# Patient Record
Sex: Male | Born: 1955 | Race: Black or African American | Hispanic: No | Marital: Single | Smoking: Never smoker
Health system: Southern US, Community
[De-identification: ages and names within clinical notes are randomized; demographics above are authoritative.]

## PROBLEM LIST (undated history)

## (undated) DIAGNOSIS — D649 Anemia, unspecified: Secondary | ICD-10-CM

## (undated) DIAGNOSIS — C801 Malignant (primary) neoplasm, unspecified: Secondary | ICD-10-CM

## (undated) DIAGNOSIS — Z789 Other specified health status: Secondary | ICD-10-CM

## (undated) DIAGNOSIS — N289 Disorder of kidney and ureter, unspecified: Secondary | ICD-10-CM

## (undated) DIAGNOSIS — N429 Disorder of prostate, unspecified: Secondary | ICD-10-CM

## (undated) HISTORY — PX: NO PAST SURGERIES: SHX2092

---

## 2012-06-23 ENCOUNTER — Inpatient Hospital Stay (HOSPITAL_COMMUNITY)
Admission: EM | Admit: 2012-06-23 | Discharge: 2012-07-14 | DRG: 666 | Disposition: A | Discharge status: Skilled Nursing Facility | Payer: MEDICAID | Attending: Internal Medicine | Admitting: Internal Medicine

## 2012-06-23 ENCOUNTER — Encounter (HOSPITAL_COMMUNITY): Payer: Self-pay | Admitting: Nurse Practitioner

## 2012-06-23 ENCOUNTER — Emergency Department (INDEPENDENT_AMBULATORY_CARE_PROVIDER_SITE_OTHER)
Admission: EM | Admit: 2012-06-23 | Discharge: 2012-06-23 | Disposition: A | Discharge status: Nursing Home (Includes ICF) | Payer: Self-pay | Source: Home / Self Care | Attending: Emergency Medicine | Admitting: Emergency Medicine

## 2012-06-23 ENCOUNTER — Emergency Department (HOSPITAL_COMMUNITY): Payer: Self-pay

## 2012-06-23 ENCOUNTER — Encounter (HOSPITAL_COMMUNITY): Payer: Self-pay | Admitting: *Deleted

## 2012-06-23 DIAGNOSIS — R319 Hematuria, unspecified: Secondary | ICD-10-CM | POA: Diagnosis present

## 2012-06-23 DIAGNOSIS — N139 Obstructive and reflux uropathy, unspecified: Principal | ICD-10-CM | POA: Diagnosis present

## 2012-06-23 DIAGNOSIS — C775 Secondary and unspecified malignant neoplasm of intrapelvic lymph nodes: Secondary | ICD-10-CM | POA: Diagnosis present

## 2012-06-23 DIAGNOSIS — D649 Anemia, unspecified: Secondary | ICD-10-CM

## 2012-06-23 DIAGNOSIS — D72829 Elevated white blood cell count, unspecified: Secondary | ICD-10-CM | POA: Diagnosis not present

## 2012-06-23 DIAGNOSIS — R599 Enlarged lymph nodes, unspecified: Secondary | ICD-10-CM | POA: Diagnosis present

## 2012-06-23 DIAGNOSIS — N35919 Unspecified urethral stricture, male, unspecified site: Secondary | ICD-10-CM | POA: Diagnosis present

## 2012-06-23 DIAGNOSIS — Z5900 Homelessness unspecified: Secondary | ICD-10-CM

## 2012-06-23 DIAGNOSIS — R030 Elevated blood-pressure reading, without diagnosis of hypertension: Secondary | ICD-10-CM

## 2012-06-23 DIAGNOSIS — I10 Essential (primary) hypertension: Secondary | ICD-10-CM | POA: Insufficient documentation

## 2012-06-23 DIAGNOSIS — N19 Unspecified kidney failure: Secondary | ICD-10-CM

## 2012-06-23 DIAGNOSIS — N1330 Unspecified hydronephrosis: Secondary | ICD-10-CM | POA: Diagnosis present

## 2012-06-23 DIAGNOSIS — R590 Localized enlarged lymph nodes: Secondary | ICD-10-CM

## 2012-06-23 DIAGNOSIS — N320 Bladder-neck obstruction: Secondary | ICD-10-CM | POA: Diagnosis present

## 2012-06-23 DIAGNOSIS — N2581 Secondary hyperparathyroidism of renal origin: Secondary | ICD-10-CM | POA: Diagnosis present

## 2012-06-23 DIAGNOSIS — C61 Malignant neoplasm of prostate: Secondary | ICD-10-CM | POA: Diagnosis present

## 2012-06-23 DIAGNOSIS — N179 Acute kidney failure, unspecified: Secondary | ICD-10-CM

## 2012-06-23 DIAGNOSIS — IMO0001 Reserved for inherently not codable concepts without codable children: Secondary | ICD-10-CM | POA: Diagnosis present

## 2012-06-23 DIAGNOSIS — R609 Edema, unspecified: Secondary | ICD-10-CM | POA: Diagnosis present

## 2012-06-23 DIAGNOSIS — R339 Retention of urine, unspecified: Secondary | ICD-10-CM | POA: Diagnosis present

## 2012-06-23 HISTORY — DX: Other specified health status: Z78.9

## 2012-06-23 LAB — CBC
HCT: 23 % — ABNORMAL LOW (ref 39.0–52.0)
Hemoglobin: 8 g/dL — ABNORMAL LOW (ref 13.0–17.0)
Hemoglobin: 7.3 g/dL — ABNORMAL LOW (ref 13.0–17.0)
MCH: 26.9 pg (ref 26.0–34.0)
MCHC: 34.4 g/dL (ref 30.0–36.0)
MCV: 78.5 fL (ref 78.0–100.0)
Platelets: 156 10*3/uL (ref 150–400)
RBC: 2.71 MIL/uL — ABNORMAL LOW (ref 4.22–5.81)
RDW: 14.7 % (ref 11.5–15.5)
WBC: 8.9 10*3/uL (ref 4.0–10.5)

## 2012-06-23 LAB — TYPE AND SCREEN: Antibody Screen: NEGATIVE

## 2012-06-23 LAB — POCT I-STAT, CHEM 8
BUN: 58 mg/dL — ABNORMAL HIGH (ref 6–23)
Calcium, Ion: 1.24 mmol/L — ABNORMAL HIGH (ref 1.12–1.23)
Chloride: 105 mEq/L (ref 96–112)
Potassium: 5.3 mEq/L — ABNORMAL HIGH (ref 3.5–5.1)

## 2012-06-23 LAB — BASIC METABOLIC PANEL
BUN: 57 mg/dL — ABNORMAL HIGH (ref 6–23)
CO2: 24 mEq/L (ref 19–32)
Chloride: 101 mEq/L (ref 96–112)
Creatinine, Ser: 5.88 mg/dL — ABNORMAL HIGH (ref 0.50–1.35)
GFR calc Af Amer: 11 mL/min — ABNORMAL LOW (ref >90)
Glucose, Bld: 130 mg/dL — ABNORMAL HIGH (ref 70–99)
Potassium: 4.9 mEq/L (ref 3.5–5.1)

## 2012-06-23 LAB — URINALYSIS, ROUTINE W REFLEX MICROSCOPIC
Bilirubin Urine: NEGATIVE
Glucose, UA: NEGATIVE mg/dL
Hgb urine dipstick: NEGATIVE
Ketones, ur: NEGATIVE mg/dL
Specific Gravity, Urine: 1.006 (ref 1.005–1.030)
pH: 6 (ref 5.0–8.0)

## 2012-06-23 LAB — CK: Total CK: 246 U/L — ABNORMAL HIGH (ref 7–232)

## 2012-06-23 LAB — OCCULT BLOOD, POC DEVICE: Fecal Occult Bld: POSITIVE

## 2012-06-23 LAB — HEPATIC FUNCTION PANEL
AST: 16 U/L (ref 0–37)
Bilirubin, Direct: <0.1 mg/dL (ref 0.0–0.3)
Total Bilirubin: 0.3 mg/dL (ref 0.3–1.2)

## 2012-06-23 LAB — ABO/RH: ABO/RH(D): B POS

## 2012-06-23 LAB — LACTIC ACID, PLASMA: Lactic Acid, Venous: 1 mmol/L (ref 0.5–2.2)

## 2012-06-23 MED ORDER — SODIUM CHLORIDE 0.9 % IV BOLUS (SEPSIS)
500.0000 mL | Freq: Once | INTRAVENOUS | Status: AC
  Administered 2012-06-23: 21:00:00 via INTRAVENOUS

## 2012-06-23 MED ORDER — SODIUM CHLORIDE 0.9 % IV SOLN
INTRAVENOUS | Status: DC
  Administered 2012-06-23 – 2012-06-24 (×2): via INTRAVENOUS

## 2012-06-23 MED ORDER — SODIUM CHLORIDE 0.9 % IV SOLN: INTRAVENOUS | Status: DC

## 2012-06-23 MED ORDER — ONDANSETRON HCL 4 MG/2ML IJ SOLN: 4.0000 mg | Freq: Three times a day (TID) | INTRAMUSCULAR | Status: DC | PRN

## 2012-06-23 NOTE — ED Notes (Signed)
Plan of care is updated with verbal understanding, will continue to monitor pt. Pt denies any pain or complaints at this time.

## 2012-06-23 NOTE — ED Provider Notes (Signed)
History     CSN: [id number]  Arrival date & time 06/23/12  1424   First MD Initiated Contact with Patient 06/23/12 1844      Chief Complaint  Patient presents with  . Acute Renal Failure    (Consider location/radiation/quality/duration/timing/severity/associated sxs/prior treatment) HPI  Patient who was initially seen at the urgent care with complaint of lower extremity swelling and cramping in his legs and hands over the last 2 days was sent to emergency department for further evaluation after his creatinine was found to be elevated at 5.8 and believed to be in acute renal failure. Patient has no known medical problems and takes no medicines on a regular basis denying regular OTC or prescription medication use. Patient states he has not been seen by a regular medical physician. Patient states that his significant other urged him to go to the urgent care to be evaluated when he continued to complain of leg cramping and swelling stating both of his feeds are swollen and hurting for the last 2 days. He works by standing for 12 hour shifts and was attributing his cramping pains in his legs to the long hours of work. But he has never experienced this amount of swelling tenderness and as he expresses "I have a fever in my feet". Patient denies other symptomatology when asked such as exertional shortness of breath, chest pains, recent falls or injuries, unintentional weight loss. He denies fevers, chills, CP, SOB, abdominal pain, n/v/d or changes in his urination. Denies dark urine. Denies known family hx of kidney disease.    History reviewed. No pertinent past medical history.  History reviewed. No pertinent past surgical history.  History reviewed. No pertinent family history.  History  Substance Use Topics  . Smoking status: Never Smoker   . Smokeless tobacco: Not on file  . Alcohol Use: No      Review of Systems  All other systems reviewed and are negative.    Allergies    Review of patient's allergies indicates no known allergies.  Home Medications  No current outpatient prescriptions on file.  BP 157/67  Pulse 82  Temp 98.5 F (36.9 C) (Oral)  Resp 18  SpO2 100%  Physical Exam  Nursing note and vitals reviewed. Constitutional: He is oriented to person, place, and time. He appears well-developed and well-nourished. No distress.  HENT:  Head: Normocephalic and atraumatic.  Eyes: Conjunctivae are normal.  Neck: Normal range of motion. Neck supple.  Cardiovascular: Normal rate, regular rhythm, normal heart sounds and intact distal pulses.  Exam reveals no gallop and no friction rub.   No murmur heard. Pulmonary/Chest: Effort normal and breath sounds normal. No respiratory distress. He has no wheezes. He has no rales. He exhibits no tenderness.  Abdominal: Soft. Bowel sounds are normal. He exhibits no distension and no mass. There is no tenderness. There is no rebound and no guarding.  Genitourinary: Guaiac positive stool.       Brown stool. No gross blood, no TTP. Normal rectal tone  Musculoskeletal: Normal range of motion. He exhibits edema and tenderness.       2+ pitting edema of bilateral lower legs with TTP. No erythema.   Neurological: He is alert and oriented to person, place, and time.  Skin: Skin is warm and dry. No rash noted. He is not diaphoretic. No erythema.  Psychiatric: He has a normal mood and affect.    ED Course  Procedures (including critical care time)  Labs Reviewed  CBC - Abnormal;  Notable for the following:    RBC 2.93 (*)     Hemoglobin 8.0 (*)     HCT 23.0 (*)     All other components within normal limits  BASIC METABOLIC PANEL - Abnormal; Notable for the following:    Glucose, Bld 130 (*)     BUN 57 (*)     Creatinine, Ser 5.88 (*)     GFR calc non Af Amer 10 (*)     GFR calc Af Amer 11 (*)     All other components within normal limits  CK - Abnormal; Notable for the following:    Total CK 246 (*)     All  other components within normal limits  URINALYSIS, ROUTINE W REFLEX MICROSCOPIC  TYPE AND SCREEN  LACTIC ACID, PLASMA  OCCULT BLOOD, POC DEVICE  ABO/RH  HEPATIC FUNCTION PANEL  CBC   Dg Chest 2 View  06/23/2012  *RADIOLOGY REPORT*  Clinical Data: Acute renal failure  CHEST - 2 VIEW  Comparison: None.  Findings: Normal heart size.  Lungs are clear.  No pneumothorax and no pleural effusion.  IMPRESSION: No active cardiopulmonary disease.  Original Report Authenticated By: Donavan Burnet, M.D.     1. Acute renal failure   2. Anemia   3. Hypertension      Date: 06/23/2012  Rate: 84  Rhythm: normal sinus rhythm  QRS Axis: normal  Intervals: normal  ST/T Wave abnormalities: normal  Conduction Disutrbances: none  Narrative Interpretation:   Old EKG Reviewed: non provocative, none for comparison  Case discussed with Dr. Ignacia Palma. Agreeable with assessment and plan.   MDM  VSS. Will be admitted for acute renal failure of unknown origin to Dr. Toniann Fail. Temp admit orders written.         [place], [place] 06/23/12 2043

## 2012-06-23 NOTE — ED Notes (Signed)
Pt  Reports   Both      Feet  Are  Swollen      Since  yest          He  Reports  Body  Cramps  As  Well          -  He  denys  Any  Injury     -  He  Reports  He  Stands  On his  Feet  All  Day  But  Has  Never  Had  Anything  Like  This  Before         He  denys  Any  Chest pain or  Any  Shortness of  Breath

## 2012-06-23 NOTE — Progress Notes (Signed)
Medical screening examination/treatment/procedure(s) were conducted as a shared visit with non-physician practitioner(s) and myself.  I personally evaluated the patient during the encounter. 56 year old man went to an urgent care center for swelling of his legs.  His health is usually good.  He is on no medications.  Exam shows him to be a late-middle-aged man in no distress.  Heart and lungs are normal.  He has edema in both legs up to the midcalf.  Lab tests show acute renal insufficiency. Recommend admission for lab workup, treatment for renal insufficiency.

## 2012-06-23 NOTE — ED Notes (Signed)
Pt went to [facility] today for bilateral lower ext swelling and body aches, found elevated BUN and creatinine. [facility] staff sent for further eval of renal failure. A&Ox4, breathing easily

## 2012-06-23 NOTE — ED Provider Notes (Signed)
History     CSN: [id number]  Arrival date & time 06/23/12  1144   First MD Initiated Contact with Patient 06/23/12 1154      Chief Complaint  Patient presents with  . Foot Swelling    (Consider location/radiation/quality/duration/timing/severity/associated sxs/prior treatment) HPI Comments: Patient presents to the urgent care today complaining that both of his feeds are swollen and hurting for the last 2 days. He works by standing for 12 hour shifts and was attributing his cramping pains in his legs to the long hours of work. But he has never experienced this amount of swelling tenderness and as he expresses "I have a fever in my feet". Patient denies other symptomatology when asked such as exertional shortness of breath, chest pains, recent falls or injuries, unintentional weight loss.  The history is provided by the patient.    History reviewed. No pertinent past medical history.  History reviewed. No pertinent past surgical history.  History reviewed. No pertinent family history.  History  Substance Use Topics  . Smoking status: Not on file  . Smokeless tobacco: Not on file  . Alcohol Use: Not on file      Review of Systems  Constitutional: Positive for activity change and fatigue. Negative for fever and chills.  Respiratory: Negative for wheezing.   Skin: Negative for color change and wound.  Neurological: Negative for dizziness.    Allergies  Review of patient's allergies indicates no known allergies.  Home Medications  No current outpatient prescriptions on file.  BP 174/85  Pulse 81  Temp 98.7 F (37.1 C) (Oral)  Resp 20  SpO2 100%  Physical Exam  Constitutional: He is oriented to person, place, and time.  Cardiovascular: Normal rate and regular rhythm.  Exam reveals no gallop.   No murmur heard. Pulmonary/Chest: Effort normal and breath sounds normal.  Musculoskeletal: He exhibits edema and tenderness.  Neurological: He is alert and oriented to  person, place, and time.  Skin: No rash noted. He is not diaphoretic. There is erythema.       ED Course  Procedures (including critical care time)  Labs Reviewed  POCT I-STAT, CHEM 8 - Abnormal; Notable for the following:    Potassium 5.3 (*)     BUN 58 (*)     Creatinine, Ser 5.80 (*)     Glucose, Bld 107 (*)     Calcium, Ion 1.24 (*)     Hemoglobin 8.8 (*)     HCT 26.0 (*)     All other components within normal limits   No results found.   1. Renal failure       MDM  Patient with lower extremity edema, found to be in renal failure, anemia and hypocalcemic. Patient presented with moderate pain in his lower extremities, denied any known medical history. Transfer patient to the emergency department for further evaluation and a renal consult. Suspect patient might also have a coexistent cellulitis.       Manuel Molly, MD 06/23/12 1345

## 2012-06-24 ENCOUNTER — Inpatient Hospital Stay (HOSPITAL_COMMUNITY): Payer: Self-pay

## 2012-06-24 DIAGNOSIS — M7989 Other specified soft tissue disorders: Secondary | ICD-10-CM

## 2012-06-24 DIAGNOSIS — I10 Essential (primary) hypertension: Secondary | ICD-10-CM

## 2012-06-24 LAB — FOLATE: Folate: >20 ng/mL

## 2012-06-24 LAB — HIV ANTIBODY (ROUTINE TESTING W REFLEX): HIV: NONREACTIVE

## 2012-06-24 LAB — RETICULOCYTES
RBC.: 2.57 MIL/uL — ABNORMAL LOW (ref 4.22–5.81)
Retic Count, Absolute: 28.3 10*3/uL (ref 19.0–186.0)
Retic Ct Pct: 1.1 % (ref 0.4–3.1)

## 2012-06-24 LAB — IRON AND TIBC
Saturation Ratios: 32 % (ref 20–55)
UIBC: 138 ug/dL (ref 125–400)

## 2012-06-24 LAB — CARDIAC PANEL(CRET KIN+CKTOT+MB+TROPI)
CK, MB: 3.1 ng/mL (ref 0.3–4.0)
Relative Index: 1.7 (ref 0.0–2.5)
Total CK: 179 U/L (ref 7–232)
Troponin I: <0.3 ng/mL (ref <0.30)

## 2012-06-24 LAB — CBC
Hemoglobin: 7.1 g/dL — ABNORMAL LOW (ref 13.0–17.0)
MCHC: 35.1 g/dL (ref 30.0–36.0)
WBC: 6.7 10*3/uL (ref 4.0–10.5)

## 2012-06-24 LAB — HEPATITIS B SURFACE ANTIGEN: Hepatitis B Surface Ag: NEGATIVE

## 2012-06-24 LAB — VITAMIN B12: Vitamin B-12: 507 pg/mL (ref 211–911)

## 2012-06-24 MED ORDER — HYDRALAZINE HCL 20 MG/ML IJ SOLN
10.0000 mg | INTRAMUSCULAR | Status: DC | PRN
  Administered 2012-06-26: 10 mg via INTRAVENOUS
  Filled 2012-06-24: qty 0.5

## 2012-06-24 MED ORDER — ACETAMINOPHEN 325 MG PO TABS
650.0000 mg | ORAL_TABLET | Freq: Four times a day (QID) | ORAL | Status: DC | PRN
  Administered 2012-06-25 – 2012-06-28 (×6): 650 mg via ORAL
  Filled 2012-06-24 (×6): qty 2

## 2012-06-24 MED ORDER — PANTOPRAZOLE SODIUM 40 MG IV SOLR
40.0000 mg | Freq: Two times a day (BID) | INTRAVENOUS | Status: DC
  Administered 2012-06-24 – 2012-06-27 (×7): 40 mg via INTRAVENOUS
  Filled 2012-06-24 (×8): qty 40

## 2012-06-24 MED ORDER — ACETAMINOPHEN 650 MG RE SUPP
650.0000 mg | Freq: Four times a day (QID) | RECTAL | Status: DC | PRN
  Filled 2012-06-24: qty 1

## 2012-06-24 MED ORDER — ONDANSETRON HCL 4 MG PO TABS: 4.0000 mg | ORAL_TABLET | Freq: Four times a day (QID) | ORAL | Status: DC | PRN

## 2012-06-24 MED ORDER — AMLODIPINE BESYLATE 5 MG PO TABS
5.0000 mg | ORAL_TABLET | Freq: Every day | ORAL | Status: DC
  Administered 2012-06-24 – 2012-06-27 (×4): 5 mg via ORAL
  Filled 2012-06-24 (×4): qty 1

## 2012-06-24 MED ORDER — SODIUM CHLORIDE 0.9 % IJ SOLN
3.0000 mL | Freq: Two times a day (BID) | INTRAMUSCULAR | Status: DC
  Administered 2012-06-24 – 2012-07-02 (×11): 3 mL via INTRAVENOUS

## 2012-06-24 MED ORDER — ONDANSETRON HCL 4 MG/2ML IJ SOLN: 4.0000 mg | Freq: Four times a day (QID) | INTRAMUSCULAR | Status: DC | PRN

## 2012-06-24 MED ORDER — SODIUM CHLORIDE 0.9 % IV SOLN: INTRAVENOUS | Status: DC

## 2012-06-24 NOTE — Consult Note (Signed)
Referring Provider: No ref. provider found Primary Care Physician:  DEFAULT,PROVIDER, MD Primary Nephrologist:  none  Reason for Consultation:  Elevated Creatinine  HPI: 56 year-old male with no significant past medical history started noticing that his both lower extremities beginning swollen over the last 2 days.metabolic panel showed elevated creatinine, hemoglobin around 8.Stool for occult blood was positive in the ER.        Past Medical History  Diagnosis Date  . No pertinent past medical history     Past Surgical History  Procedure Date  . No past surgeries     Prior to Admission medications   Not on File    Current Facility-Administered Medications  Medication Dose Route Frequency Provider Last Rate Last Dose  . 0.9 %  sodium chloride infusion   Intravenous Continuous Eduard Clos, MD      . acetaminophen (TYLENOL) tablet 650 mg  650 mg Oral Q6H PRN Eduard Clos, MD       Or  . acetaminophen (TYLENOL) suppository 650 mg  650 mg Rectal Q6H PRN Eduard Clos, MD      . hydrALAZINE (APRESOLINE) injection 10 mg  10 mg Intravenous Q4H PRN Eduard Clos, MD      . ondansetron [facility]) tablet 4 mg  4 mg Oral Q6H PRN Eduard Clos, MD       Or  . ondansetron [facility]) injection 4 mg  4 mg Intravenous Q6H PRN Eduard Clos, MD      . pantoprazole (PROTONIX) injection 40 mg  40 mg Intravenous Q12H Eduard Clos, MD      . sodium chloride 0.9 % bolus 500 mL  500 mL Intravenous Once [employer], PA      . sodium chloride 0.9 % injection 3 mL  3 mL Intravenous Q12H Eduard Clos, MD      . DISCONTD: 0.9 %  sodium chloride infusion   Intravenous Continuous Drucie Opitz, PA 125 mL/hr at 06/24/12 0616    . DISCONTD: 0.9 %  sodium chloride infusion   Intravenous STAT Bethany Hunt, PA      . DISCONTD: ondansetron (ZOFRAN) injection 4 mg  4 mg Intravenous Q8H PRN Drucie Opitz, PA        Allergies as of 06/23/2012  . (No Known  Allergies)    History reviewed. No pertinent family history.  History   Social History  . Marital Status: Single    Spouse Name: N/A    Number of Children: N/A  . Years of Education: N/A   Occupational History  . Not on file.   Social History Main Topics  . Smoking status: Never Smoker   . Smokeless tobacco: Not on file  . Alcohol Use: No  . Drug Use: No  . Sexually Active:    Other Topics Concern  . Not on file   Social History Narrative  . No narrative on file    Review of Systems: Gen: Denies any fever, chills, sweats, anorexia, fatigue, weakness, malaise, weight loss, and sleep disorder HEENT: No visual complaints, No history of Retinopathy. Normal external appearance No Epistaxis or Sore throat. No sinusitis.   CV: Denies chest pain, angina, palpitations, syncope, orthopnea, PND, peripheral edema, and claudication. Resp: Denies dyspnea at rest, dyspnea with exercise, cough, sputum, wheezing, coughing up blood, and pleurisy. GI: Denies vomiting blood, jaundice, and fecal incontinence.   Denies dysphagia or odynophagia. GU : Denies urinary burning, blood in urine, urinary frequency, urinary hesitancy, nocturnal urination,  and urinary incontinence.  No renal calculi. MS: Denies joint pain, limitation of movement, and swelling, stiffness, low back pain, extremity pain. Denies muscle weakness, cramps, atrophy.  No use of non steroidal antiinflammatory drugs. Derm: Denies rash, itching, dry skin, hives, moles, warts, or unhealing ulcers.  Psych: Denies depression, anxiety, memory loss, suicidal ideation, hallucinations, paranoia, and confusion. Heme: Denies bruising, bleeding, and enlarged lymph nodes. Neuro: No headache.  No diplopia. No dysarthria.  No dysphasia.  No history of CVA.  No Seizures. No paresthesias.  No weakness. Endocrine No DM.  No Thyroid disease.  No Adrenal disease.  Physical Exam: Vital signs in last 24 hours: Temp:  [97.9 F (36.6 C)-98.8 F (37.1  C)] 98.3 F (36.8 C) (07/25 0444) Pulse Rate:  [73-90] 73  (07/25 0444) Resp:  [14-20] 18  (07/25 0444) BP: (149-191)/(67-95) 149/82 mmHg (07/25 0444) SpO2:  [99 %-100 %] 100 % (07/25 0444) Weight:  [82.6 kg (182 lb 1.6 oz)] 82.6 kg (182 lb 1.6 oz) (07/24 2154) Last BM Date: 06/22/12 General:   Alert,  Well-developed, well-nourished, pleasant and cooperative in NAD Head:  Normocephalic and atraumatic. Eyes:  Sclera clear, no icterus.   Conjunctiva pink. Ears:  Normal auditory acuity. Nose:  No deformity, discharge,  or lesions. Mouth:  No deformity or lesions, dentition normal. Neck:  Supple; no masses or thyromegaly. JVP not elevated Lungs:  Clear throughout to auscultation.   No wheezes, crackles, or rhonchi. No acute distress. Heart:  Regular rate and rhythm; no murmurs, clicks, rubs,  or gallops. Abdomen:  Soft, nontender and nondistended. No masses, hepatosplenomegaly or hernias noted. Normal bowel sounds, without guarding, and without rebound.   Msk:  Symmetrical without gross deformities. Normal posture. Pulses:  No carotid, renal, femoral bruits. DP and PT symmetrical and equal Extremities:  Without clubbing , 1 + edema Neurologic:  Alert and  oriented x4;  grossly normal neurologically. Skin:  Intact without significant lesions or rashes. Cervical Nodes:  No significant cervical adenopathy. Psych:  Alert and cooperative. Normal mood and affect.  Intake/Output from previous day: 07/24 0701 - 07/25 0700 In: 1172.9 [I.V.:1172.9] Out: 250 [Urine:250] Intake/Output this shift:    Lab Results:  Basename 06/23/12 2108 06/23/12 1511 06/23/12 1327  WBC 7.6 8.9 --  HGB 7.3* 8.0* 8.8*  HCT 21.2* 23.0* 26.0*  PLT 156 171 --   BMET  Basename 06/24/12 0615 06/23/12 1511 06/23/12 1327  NA -- 139 140  K -- 4.9 5.3*  CL -- 101 105  CO2 -- 24 --  GLUCOSE -- 130* 107*  BUN -- 57* 58*  CREATININE -- 5.88* 5.80*  CALCIUM -- 9.7 --  PHOS 5.0* -- --   LFT  Basename 06/23/12  2108  PROT 7.6  ALBUMIN 3.5  AST 16  ALT 13  ALKPHOS 37*  BILITOT 0.3  BILIDIR <0.1  IBILI NOT CALCULATED   PT/INR No results found for this basename: LABPROT:2,INR:2 in the last 72 hours Hepatitis Panel No results found for this basename: HEPBSAG,HCVAB,HEPAIGM,HEPBIGM in the last 72 hours  Studies/Results: Dg Chest 2 View  06/23/2012  *RADIOLOGY REPORT*  Clinical Data: Acute renal failure  CHEST - 2 VIEW  Comparison: None.  Findings: Normal heart size.  Lungs are clear.  No pneumothorax and no pleural effusion.  IMPRESSION: No active cardiopulmonary disease.  Original Report Authenticated By: Donavan Burnet, M.D.    Assessment/Plan:  Renal Failure- most likely Chronic with a bland urine sediment, he will need evaluation with serologies for HIV  and Hepatitis. An SPEP and UPEP would be appropriate and renal ultrasound.  HTN improved  Anemia will check Iron stores  Bones check PTH  Access planning will check vein mapping and refer to [facility]  I had a long conversation about renal disease and the high likelihood of needing dialysis. We will show him the dialysis video and schedule appointments. Patient appears in a certain amount of denial at this time.   LOS: 1 Krissie Merrick W @TODAY @8 :30 AM

## 2012-06-24 NOTE — Progress Notes (Signed)
VASCULAR LAB PRELIMINARY  PRELIMINARY  PRELIMINARY  PRELIMINARY  Bilateral lower extremity venous duplex completed.    Preliminary report:  Bilateral:  No evidence of DVT, superficial thrombosis, or Baker's Cyst.   Jaydalee Bardwell, RVS 06/24/2012, 11:59 AM

## 2012-06-24 NOTE — ED Provider Notes (Signed)
Medical screening examination/treatment/procedure(s) were conducted as a shared visit with non-physician practitioner(s) and myself. I personally evaluated the patient during the encounter.  56 year old man went to an urgent care center for swelling of his legs. His health is usually good. He is on no medications. Exam shows him to be a late-middle-aged man in no distress. Heart and lungs are normal. He has edema in both legs up to the midcalf. Lab tests show acute renal insufficiency.  Recommend admission for lab workup, treatment for renal insufficiency.    Carleene Cooper III, MD 06/24/12 1210

## 2012-06-24 NOTE — H&P (Signed)
Manuel Lucas is an 56 y.o. male.   Patient was seen and examined on June 23, 2012 at 10:35 PM. PCP - None Chief Complaint: Lower extremity swelling. HPI: 56 year-old male with no significant past medical history started noticing that his both lower extremities beginning swollen over the last 2 days. Since this was persistent he decided to go to the urgent care where his metabolic panel showed elevated creatinine and he was referred to the ER at [facility]. Patient in addition was found to be anemic with hemoglobin around 8. Patient has never been hospitalized previously recently and we don't have any labs to compare. Patient denies any chest pain or shortness of breath or abdominal pain nausea vomiting or diarrhea. Denies using any NSAID and hasn't noticed any black stools. Patient does not have any dizziness weakness or fatigue. Stool for occult blood was positive in the ER. Patient will be admitted for further observation and management.  Past Medical History  Diagnosis Date  . No pertinent past medical history     Past Surgical History  Procedure Date  . No past surgeries     History reviewed. No pertinent family history. Social History:  reports that he has never smoked. He does not have any smokeless tobacco history on file. He reports that he does not drink alcohol or use illicit drugs.  Allergies: No Known Allergies  No prescriptions prior to admission    Results for orders placed during the hospital encounter of 06/23/12 (from the past 48 hour(s))  CBC     Status: Abnormal   Collection Time   06/23/12  3:11 PM      Component Value Range Comment   WBC 8.9  4.0 - 10.5 K/uL    RBC 2.93 (*) 4.22 - 5.81 MIL/uL    Hemoglobin 8.0 (*) 13.0 - 17.0 g/dL    HCT 16.1 (*) 09.6 - 52.0 %    MCV 78.5  78.0 - 100.0 fL    MCH 27.3  26.0 - 34.0 pg    MCHC 34.8  30.0 - 36.0 g/dL    RDW 04.5  40.9 - 81.1 %    Platelets 171  150 - 400 K/uL   BASIC METABOLIC PANEL     Status: Abnormal   Collection Time   06/23/12  3:11 PM      Component Value Range Comment   Sodium 139  135 - 145 mEq/L    Potassium 4.9  3.5 - 5.1 mEq/L    Chloride 101  96 - 112 mEq/L    CO2 24  19 - 32 mEq/L    Glucose, Bld 130 (*) 70 - 99 mg/dL    BUN 57 (*) 6 - 23 mg/dL    Creatinine, Ser 9.14 (*) 0.50 - 1.35 mg/dL    Calcium 9.7  8.4 - 78.2 mg/dL    GFR calc non Af Amer 10 (*) >90 mL/min    GFR calc Af Amer 11 (*) >90 mL/min   URINALYSIS, ROUTINE W REFLEX MICROSCOPIC     Status: Normal   Collection Time   06/23/12  4:17 PM      Component Value Range Comment   Color, Urine YELLOW  YELLOW    APPearance CLEAR  CLEAR    Specific Gravity, Urine 1.006  1.005 - 1.030    pH 6.0  5.0 - 8.0    Glucose, UA NEGATIVE  NEGATIVE mg/dL    Hgb urine dipstick NEGATIVE  NEGATIVE    Bilirubin Urine  NEGATIVE  NEGATIVE    Ketones, ur NEGATIVE  NEGATIVE mg/dL    Protein, ur NEGATIVE  NEGATIVE mg/dL    Urobilinogen, UA 0.2  0.0 - 1.0 mg/dL    Nitrite NEGATIVE  NEGATIVE    Leukocytes, UA NEGATIVE  NEGATIVE MICROSCOPIC NOT DONE ON URINES WITH NEGATIVE PROTEIN, BLOOD, LEUKOCYTES, NITRITE, OR GLUCOSE <1000 mg/dL.  TYPE AND SCREEN     Status: Normal   Collection Time   06/23/12  6:57 PM      Component Value Range Comment   ABO/RH(D) B POS      Antibody Screen NEG      Sample Expiration 06/26/2012     ABO/RH     Status: Normal   Collection Time   06/23/12  6:57 PM      Component Value Range Comment   ABO/RH(D) B POS     CK     Status: Abnormal   Collection Time   06/23/12  6:59 PM      Component Value Range Comment   Total CK 246 (*) 7 - 232 U/L   LACTIC ACID, PLASMA     Status: Normal   Collection Time   06/23/12  7:05 PM      Component Value Range Comment   Lactic Acid, Venous 1.0  0.5 - 2.2 mmol/L   OCCULT BLOOD, POC DEVICE     Status: Normal   Collection Time   06/23/12  7:37 PM      Component Value Range Comment   Fecal Occult Bld POSITIVE     HEPATIC FUNCTION PANEL     Status: Abnormal   Collection Time    06/23/12  9:08 PM      Component Value Range Comment   Total Protein 7.6  6.0 - 8.3 g/dL    Albumin 3.5  3.5 - 5.2 g/dL    AST 16  0 - 37 U/L    ALT 13  0 - 53 U/L    Alkaline Phosphatase 37 (*) 39 - 117 U/L    Total Bilirubin 0.3  0.3 - 1.2 mg/dL    Bilirubin, Direct <1.4  0.0 - 0.3 mg/dL    Indirect Bilirubin NOT CALCULATED  0.3 - 0.9 mg/dL   CBC     Status: Abnormal   Collection Time   06/23/12  9:08 PM      Component Value Range Comment   WBC 7.6  4.0 - 10.5 K/uL    RBC 2.71 (*) 4.22 - 5.81 MIL/uL    Hemoglobin 7.3 (*) 13.0 - 17.0 g/dL    HCT 78.2 (*) 95.6 - 52.0 %    MCV 78.2  78.0 - 100.0 fL    MCH 26.9  26.0 - 34.0 pg    MCHC 34.4  30.0 - 36.0 g/dL    RDW 21.3  08.6 - 57.8 %    Platelets 156  150 - 400 K/uL    Dg Chest 2 View  06/23/2012  *RADIOLOGY REPORT*  Clinical Data: Acute renal failure  CHEST - 2 VIEW  Comparison: None.  Findings: Normal heart size.  Lungs are clear.  No pneumothorax and no pleural effusion.  IMPRESSION: No active cardiopulmonary disease.  Original Report Authenticated By: Donavan Burnet, M.D.    Review of Systems  Constitutional: Negative.   HENT: Negative.   Eyes: Negative.   Respiratory: Negative.   Cardiovascular: Negative.   Gastrointestinal: Negative.   Genitourinary: Negative.   Musculoskeletal:       Lower  extremity swelling.  Skin: Negative.   Neurological: Negative.   Endo/Heme/Allergies: Negative.   Psychiatric/Behavioral: Negative.     Blood pressure 191/83, pulse 90, temperature 98.8 F (37.1 C), temperature source Oral, resp. rate 16, height 5\' 9"  (1.753 m), weight 82.6 kg (182 lb 1.6 oz), SpO2 99.00%. Physical Exam  Constitutional: He is oriented to person, place, and time. He appears well-developed and well-nourished. No distress.  HENT:  Head: Normocephalic and atraumatic.  Right Ear: External ear normal.  Left Ear: External ear normal.  Nose: Nose normal.  Mouth/Throat: Oropharynx is clear and moist. No  oropharyngeal exudate.  Eyes: Pupils are equal, round, and reactive to light.       Pallor.  Neck: Normal range of motion. Neck supple.  Cardiovascular: Normal rate and regular rhythm.   Respiratory: Effort normal and breath sounds normal. No respiratory distress. He has no wheezes. He has no rales.  GI: Soft. Bowel sounds are normal. He exhibits no distension. There is no tenderness. There is no rebound.  Musculoskeletal: Normal range of motion. He exhibits no edema and no tenderness.  Neurological: He is alert and oriented to person, place, and time.       Moves all extremities.  Skin: Skin is warm and dry. He is not diaphoretic.  Psychiatric: His behavior is normal.     Assessment/Plan #1. Renal failure, probably chronic could be due to his hypertension - at this time I have discussed with Dr. Reynolds Bowl, nephrologist on call. For now we will get renal sonogram and closely follow intake output and metabolic panel. Patient's urine looks normal. Albumin levels are normal too. Check 2-D echo and Doppler of the lower extremities due to edema. #2. Anemia, normocytic normochromic - at this time patient is hemodynamically stable. Anemia could be from the renal failure but GI source is also a concern. Check anemia panel. Recheck in a CBC again in a.m. At this time patient is not keen on getting a transfusion unless it is deemed necessary at that point we need to discuss with the patient again. For now I have kept patient n.p.o. if in case patient needs EGD or other procedures if there is further drop in hemoglobin. Type and screen has been ordered. #3. Elevated blood pressure - for now I kept patient on when necessary IV hydralazine for systolic blood pressure was 160. His blood pressure continues to be high patient will need definite antihypertensives.  CODE STATUS - full code.  KAKRAKANDY,ARSHAD N. 06/24/2012, 12:15 AM

## 2012-06-24 NOTE — Progress Notes (Signed)
[facility] PROGRESS NOTE  Manuel Lucas JWJ:[id number] DOB: 03-Sep-1956 DOA: 06/23/2012 PCP: Sheila Oats, MD  Assessment/Plan: Principal Problem:  *Renal failure Active Problems:  Anemia  Elevated blood pressure  1. Renal Failure - Will Defer to Nephrology and follow up with their recommendations  2. Elevated blood pressures - Not well controlled, will plan on starting patient on amlodipine - May have contributed to # 1 - Obtain EKG - review U/A  3 Anemia - At this point patient has history this admission of hemoccult positive stool.  He denies seeing any BRBPR or melena.  May consider having patient evaluated as outpatient for colonoscopy.  None urgent at this juncture.  Nonetheless pending prolonged stay may consider consulting GI for further recommendations. - # 1 most likely contributing - f/u labs (iron studies) - Check cbc tomorrow consider transfusion if hgb less than 7.0  Code Status: Full Family Communication: None at bedside Disposition Plan: Pending clinical improvement   Brief narrative: Please see HPI  Consultants:  Nephrology  Procedures:  None currently  Antibiotics:  None  HPI/Subjective: Patient mentions that he had some leg swelling initially and that is the main reason he presented to the hospital.  Seems to think that the swelling is improved.  Denies any chest pain, nausea, emesis, new focal neurological weakness.  Mentions that he is hungry and would like to eat.  Objective: Filed Vitals:   06/24/12 0444 06/24/12 0940 06/24/12 1412 06/24/12 1748  BP: 149/82 141/85 139/66 158/67  Pulse: 73 81 85 78  Temp: 98.3 F (36.8 C) 98.2 F (36.8 C) 98.5 F (36.9 C) 98.7 F (37.1 C)  TempSrc: Oral  Oral Oral  Resp: 18 18 18 18   Height:      Weight:      SpO2: 100% 100% 100% 100%    Intake/Output Summary (Last 24 hours) at 06/24/12 1923 Last data filed at 06/24/12 1500  Gross per 24 hour  Intake 1812.92 ml  Output   1065 ml    Net 747.92 ml    Exam:   General:  Pt in NAD, A and O x 3  Cardiovascular: RRR, No MRG  Respiratory: CTA BL, no wheezes  Abdomen: Soft, NT, ND  Data Reviewed: Basic Metabolic Panel:  Lab 06/24/12 6213 06/23/12 1511 06/23/12 1327  NA -- 139 140  K -- 4.9 5.3*  CL -- 101 105  CO2 -- 24 --  GLUCOSE -- 130* 107*  BUN -- 57* 58*  CREATININE -- 5.88* 5.80*  CALCIUM -- 9.7 --  MG -- -- --  PHOS 5.0* -- --   Liver Function Tests:  Lab 06/23/12 2108  AST 16  ALT 13  ALKPHOS 37*  BILITOT 0.3  PROT 7.6  ALBUMIN 3.5   No results found for this basename: LIPASE:5,AMYLASE:5 in the last 168 hours No results found for this basename: AMMONIA:5 in the last 168 hours CBC:  Lab 06/24/12 0757 06/23/12 2108 06/23/12 1511 06/23/12 1327  WBC 6.7 7.6 8.9 --  NEUTROABS -- -- -- --  HGB 7.1* 7.3* 8.0* 8.8*  HCT 20.2* 21.2* 23.0* 26.0*  MCV 78.6 78.2 78.5 --  PLT 136* 156 171 --   Cardiac Enzymes:  Lab 06/24/12 0800 06/23/12 1859  CKTOTAL 179 246*  CKMB 3.1 --  CKMBINDEX -- --  TROPONINI <0.30 --   BNP (last 3 results) No results found for this basename: PROBNP:3 in the last 8760 hours CBG: No results found for this basename: GLUCAP:5 in the last 168 hours  No results found for this or any previous visit (from the past 240 hour(s)).   Studies: Dg Chest 2 View  06/23/2012  *RADIOLOGY REPORT*  Clinical Data: Acute renal failure  CHEST - 2 VIEW  Comparison: None.  Findings: Normal heart size.  Lungs are clear.  No pneumothorax and no pleural effusion.  IMPRESSION: No active cardiopulmonary disease.  Original Report Authenticated By: Donavan Burnet, M.D.   US Renal  06/24/2012  *RADIOLOGY REPORT*  Clinical Data: Renal failure.  RENAL/URINARY TRACT ULTRASOUND COMPLETE  Comparison:  None.  Findings:  Right Kidney:  The right kidney measures 10.6 cm in length with slightly increased echotexture.  There is mild/moderate right hydronephrosis.  Left Kidney:  The left kidney  measures 11.3 cm in length.  There is mild/moderate left hydronephrosis.  Bladder:  The urinary bladder is filled with urine.  There are bilateral ureteral jets.  IMPRESSION: Mild/moderate bilateral hydronephrosis.  The urinary bladder is distended and there are bilateral ureteral jets.  Findings raise concern for urinary bladder retention and/or bladder outlet obstruction.  Original Report Authenticated By: Richarda Overlie, M.D.    Scheduled Meds:   . pantoprazole (PROTONIX) IV  40 mg Intravenous Q12H  . sodium chloride  500 mL Intravenous Once  . sodium chloride  3 mL Intravenous Q12H  . DISCONTD: sodium chloride   Intravenous STAT   Continuous Infusions:   . sodium chloride 20 mL/hr at 06/24/12 0730  . DISCONTD: sodium chloride 125 mL/hr at 06/24/12 1610    Principal Problem:  *Renal failure Active Problems:  Anemia  Elevated blood pressure    Time spent: > 35 minutes    Penny Pia  [facility] Pager [phone]. If 8PM-8AM, please contact night-coverage at [email or web address], password [facility] 06/24/2012, 7:23 PM  LOS: 1 day

## 2012-06-25 LAB — CBC
MCH: 27.1 pg (ref 26.0–34.0)
MCV: 78.2 fL (ref 78.0–100.0)
Platelets: 152 10*3/uL (ref 150–400)
RDW: 14.3 % (ref 11.5–15.5)
WBC: 5.7 10*3/uL (ref 4.0–10.5)

## 2012-06-25 LAB — COMPREHENSIVE METABOLIC PANEL
AST: 18 U/L (ref 0–37)
Albumin: 3.3 g/dL — ABNORMAL LOW (ref 3.5–5.2)
BUN: 57 mg/dL — ABNORMAL HIGH (ref 6–23)
Chloride: 104 mEq/L (ref 96–112)
Creatinine, Ser: 6.87 mg/dL — ABNORMAL HIGH (ref 0.50–1.35)
Total Bilirubin: 0.3 mg/dL (ref 0.3–1.2)
Total Protein: 7.2 g/dL (ref 6.0–8.3)

## 2012-06-25 LAB — PSA: PSA: 106.2 ng/mL — ABNORMAL HIGH (ref <=4.00)

## 2012-06-25 MED ORDER — DARBEPOETIN ALFA-POLYSORBATE 60 MCG/0.3ML IJ SOLN
60.0000 ug | Freq: Once | INTRAMUSCULAR | Status: AC
  Administered 2012-06-25: 60 ug via SUBCUTANEOUS
  Filled 2012-06-25: qty 0.3

## 2012-06-25 NOTE — Progress Notes (Addendum)
[facility] ROUNDING NOTE   Subjective:   Interval History:none  Objective:  Vital signs in last 24 hours:  Temp:  [97.5 F (36.4 C)-98.7 F (37.1 C)] 97.5 F (36.4 C) (07/26 0455) Pulse Rate:  [63-85] 63  (07/26 0455) Resp:  [18] 18  (07/26 0455) BP: (139-161)/(66-85) 159/83 mmHg (07/26 0455) SpO2:  [98 %-100 %] 98 % (07/26 0455) Weight:  [82.6 kg (182 lb 1.6 oz)] 82.6 kg (182 lb 1.6 oz) (07/25 2047)  Weight change: 0 kg (0 lb) Filed Weights   06/23/12 2154 06/24/12 2047  Weight: 82.6 kg (182 lb 1.6 oz) 82.6 kg (182 lb 1.6 oz)    Intake/Output: I/O last 3 completed shifts: In: 3173.6 [P.O.:1680; I.V.:1493.6] Out: 1965 [Urine:1965]   Intake/Output this shift:  Total I/O In: 360 [P.O.:360] Out: -   CVS- RRR RS- CTA ABD- BS present soft non-distended EXT- no edema   Basic Metabolic Panel:  Lab 06/25/12 1610 06/24/12 0615 06/23/12 1511 06/23/12 1327  NA 139 -- 139 140  K 4.9 -- 4.9 5.3*  CL 104 -- 101 105  CO2 23 -- 24 --  GLUCOSE 97 -- 130* 107*  BUN 57* -- 57* 58*  CREATININE 6.87* -- 5.88* 5.80*  CALCIUM 8.8 -- 9.7 --  MG -- -- -- --  PHOS -- 5.0* -- --    Liver Function Tests:  Lab 06/25/12 0600 06/23/12 2108  AST 18 16  ALT 12 13  ALKPHOS 38* 37*  BILITOT 0.3 0.3  PROT 7.2 7.6  ALBUMIN 3.3* 3.5   No results found for this basename: LIPASE:5,AMYLASE:5 in the last 168 hours No results found for this basename: AMMONIA:3 in the last 168 hours  CBC:  Lab 06/25/12 0600 06/24/12 0757 06/23/12 2108 06/23/12 1511 06/23/12 1327  WBC 5.7 6.7 7.6 8.9 --  NEUTROABS -- -- -- -- --  HGB 7.2* 7.1* 7.3* 8.0* 8.8*  HCT 20.8* 20.2* 21.2* 23.0* 26.0*  MCV 78.2 78.6 78.2 78.5 --  PLT 152 136* 156 171 --    Cardiac Enzymes:  Lab 06/24/12 0800 06/23/12 1859  CKTOTAL 179 246*  CKMB 3.1 --  CKMBINDEX -- --  TROPONINI <0.30 --    BNP: No components found with this basename: POCBNP:5  CBG: No results found for this basename: GLUCAP:5  in the last 168 hours  Microbiology: No results found for this or any previous visit.  Coagulation Studies: No results found for this basename: LABPROT:5,INR:5 in the last 72 hours  Urinalysis:  Basename 06/23/12 1617  COLORURINE YELLOW  LABSPEC 1.006  PHURINE 6.0  GLUCOSEU NEGATIVE  HGBUR NEGATIVE  BILIRUBINUR NEGATIVE  KETONESUR NEGATIVE  PROTEINUR NEGATIVE  UROBILINOGEN 0.2  NITRITE NEGATIVE  LEUKOCYTESUR NEGATIVE      Imaging: Dg Chest 2 View  06/23/2012  *RADIOLOGY REPORT*  Clinical Data: Acute renal failure  CHEST - 2 VIEW  Comparison: None.  Findings: Normal heart size.  Lungs are clear.  No pneumothorax and no pleural effusion.  IMPRESSION: No active cardiopulmonary disease.  Original Report Authenticated By: Donavan Burnet, M.D.   US Renal  06/24/2012  *RADIOLOGY REPORT*  Clinical Data: Renal failure.  RENAL/URINARY TRACT ULTRASOUND COMPLETE  Comparison:  None.  Findings:  Right Kidney:  The right kidney measures 10.6 cm in length with slightly increased echotexture.  There is mild/moderate right hydronephrosis.  Left Kidney:  The left kidney measures 11.3 cm in length.  There is mild/moderate left hydronephrosis.  Bladder:  The urinary bladder is filled with  urine.  There are bilateral ureteral jets.  IMPRESSION: Mild/moderate bilateral hydronephrosis.  The urinary bladder is distended and there are bilateral ureteral jets.  Findings raise concern for urinary bladder retention and/or bladder outlet obstruction.  Original Report Authenticated By: Richarda Overlie, M.D.     Medications:      . sodium chloride 20 mL/hr at 06/24/12 0730      . amLODipine  5 mg Oral Daily  . pantoprazole (PROTONIX) IV  40 mg Intravenous Q12H  . sodium chloride  3 mL Intravenous Q12H   acetaminophen, acetaminophen, hydrALAZINE, ondansetron (ZOFRAN) IV, ondansetron  Assessment/ Plan:  56 year-old male with no significant past medical history started noticing that his both lower  extremities beginning swollen over the last 2 days.metabolic panel showed elevated creatinine, hemoglobin around 8.Stool for occult blood was positive in the ER.   Renal Failure- most likely Chronic with a bland urine sediment, he will need evaluation  HIV and Hep negative  HTN improved Anemia will check Iron stores   Bones check PTH   Access planning will check vein mapping and refer to [facility]   Will set up outpatient follow up. Ultrasound showed wild hydro. Recommend foley catheter and PSA    LOS: 2 [place]ready W @TODAY @9 :03 AM

## 2012-06-25 NOTE — Progress Notes (Signed)
[facility] PROGRESS NOTE  Manuel Lucas JWJ:[id number] DOB: 10/04/1956 DOA: 06/23/2012 PCP: Sheila Oats, MD  Assessment/Plan: Principal Problem:  *Renal failure Active Problems:  Anemia  Elevated blood pressure  1. Renal Failure:   - Renal ultrasound interpreted as mild/moderate BL hydronephrosis.  Urinary bladder is distended and there are bilateral ureteral jets.  Concern for urinary bladder retention and /or bladder outlet obstruction.  - Nephrology on board and made recommendations this Am.  I placed order for foley to be placed.  Nursing reports difficulty placing foley x 2. - Placed consult for Urology to evaluate patient and help place foley given that nurses were unable to.  As well as provide recommendations from their standpoint given current clinical scenario.  2. Anemia - Stable with no visible active bleeding today - Anemia panel reviewed  3. Blood pressure - Not well controlled and as a result will have to add antihypertensive agent today.  Will add amlodipine today and reassess.  Code Status: Full  Family Communication: None at bedside  Disposition Plan: Pending clinical improvement   Brief narrative:  Patient is a 56 y/o with no prior follow up with a PCP that presented to the ED complaining of increased edema BL in his legs.  Upon further work-up patient was found to have an elevated creatinine and he was admitted for further evaluation and recommendations.  Nephrology was called for further evaluation and recommendations.  After Renal U/S was obtained which showed BL mild/moderate hydronephrosis and other findings it was recommended to place foley.  Nursing tried two times to place foley but were unsuccessful.  Subsequently Urology was called for further recommendations.  Consultants:  Nephrology Procedures:  None currently Antibiotics:  None  HPI/Subjective: Patient has no new complaints.  Denies any fever, chills, abdominal discomfort.  Is  inquiring about what a foley is and why he needs it.  I have discussed reasons with patient today.  Objective: Filed Vitals:   06/24/12 2047 06/25/12 0455 06/25/12 0913 06/25/12 1327  BP: 161/79 159/83 146/73 151/85  Pulse: 72 63 70 73  Temp: 98.4 F (36.9 C) 97.5 F (36.4 C) 97.8 F (36.6 C) 98.1 F (36.7 C)  TempSrc: Oral Oral Oral Oral  Resp: 18 18 20 20   Height: 5\' 9"  (1.753 m)     Weight: 82.6 kg (182 lb 1.6 oz)     SpO2: 100% 98% 95% 100%    Intake/Output Summary (Last 24 hours) at 06/25/12 1712 Last data filed at 06/25/12 1300  Gross per 24 hour  Intake 1720.67 ml  Output    900 ml  Net 820.67 ml    Exam:  General: Pt in NAD, A and O x 3  Cardiovascular: RRR, No MRG  Respiratory: CTA BL, no wheezes  Abdomen: Soft, NT, ND Data Reviewed: Basic Metabolic Panel:  Lab 06/25/12 6213 06/24/12 0615 06/23/12 1511 06/23/12 1327  NA 139 -- 139 140  K 4.9 -- 4.9 5.3*  CL 104 -- 101 105  CO2 23 -- 24 --  GLUCOSE 97 -- 130* 107*  BUN 57* -- 57* 58*  CREATININE 6.87* -- 5.88* 5.80*  CALCIUM 8.8 -- 9.7 --  MG -- -- -- --  PHOS -- 5.0* -- --   Liver Function Tests:  Lab 06/25/12 0600 06/23/12 2108  AST 18 16  ALT 12 13  ALKPHOS 38* 37*  BILITOT 0.3 0.3  PROT 7.2 7.6  ALBUMIN 3.3* 3.5   No results found for this basename: LIPASE:5,AMYLASE:5 in the last 168  hours No results found for this basename: AMMONIA:5 in the last 168 hours CBC:  Lab 06/25/12 0600 06/24/12 0757 06/23/12 2108 06/23/12 1511 06/23/12 1327  WBC 5.7 6.7 7.6 8.9 --  NEUTROABS -- -- -- -- --  HGB 7.2* 7.1* 7.3* 8.0* 8.8*  HCT 20.8* 20.2* 21.2* 23.0* 26.0*  MCV 78.2 78.6 78.2 78.5 --  PLT 152 136* 156 171 --   Cardiac Enzymes:  Lab 06/24/12 0800 06/23/12 1859  CKTOTAL 179 246*  CKMB 3.1 --  CKMBINDEX -- --  TROPONINI <0.30 --   BNP (last 3 results) No results found for this basename: PROBNP:3 in the last 8760 hours CBG: No results found for this basename: GLUCAP:5 in the last 168  hours  No results found for this or any previous visit (from the past 240 hour(s)).   Studies: Dg Chest 2 View  06/23/2012  *RADIOLOGY REPORT*  Clinical Data: Acute renal failure  CHEST - 2 VIEW  Comparison: None.  Findings: Normal heart size.  Lungs are clear.  No pneumothorax and no pleural effusion.  IMPRESSION: No active cardiopulmonary disease.  Original Report Authenticated By: Donavan Burnet, M.D.   US Renal  06/24/2012  *RADIOLOGY REPORT*  Clinical Data: Renal failure.  RENAL/URINARY TRACT ULTRASOUND COMPLETE  Comparison:  None.  Findings:  Right Kidney:  The right kidney measures 10.6 cm in length with slightly increased echotexture.  There is mild/moderate right hydronephrosis.  Left Kidney:  The left kidney measures 11.3 cm in length.  There is mild/moderate left hydronephrosis.  Bladder:  The urinary bladder is filled with urine.  There are bilateral ureteral jets.  IMPRESSION: Mild/moderate bilateral hydronephrosis.  The urinary bladder is distended and there are bilateral ureteral jets.  Findings raise concern for urinary bladder retention and/or bladder outlet obstruction.  Original Report Authenticated By: Richarda Overlie, M.D.    Scheduled Meds:   . amLODipine  5 mg Oral Daily  . darbepoetin (ARANESP) injection - DIALYSIS  60 mcg Subcutaneous Once  . pantoprazole (PROTONIX) IV  40 mg Intravenous Q12H  . sodium chloride  3 mL Intravenous Q12H   Continuous Infusions:   . sodium chloride 20 mL/hr at 06/24/12 0730    Principal Problem:  *Renal failure Active Problems:  Anemia  Elevated blood pressure    Time spent: > 35 minutes    Penny Pia  [facility] Pager [phone]. If 8PM-8AM, please contact night-coverage at [email or web address], password [facility] 06/25/2012, 5:12 PM  LOS: 2 days

## 2012-06-25 NOTE — Consult Note (Signed)
Urology Consult  Referring physician: Hospitalist Reason for referral: We're asked to consult on this patient with apparent urinary retention secondary to bladder neck obstruction/urethral stricture disease with bilateral hydronephrosis and acute versus chronic renal failure. Nursing staff was Unable to Pl., Foley catheter. Patient denies any prior urologic history. He does to him he has had some difficulty voiding prior to admission and was admitted with leg edema. He denies any known history of urethral stricture disease prostate cancer or any GU instrumentation. Renal ultrasound showed a distended bladder with at least some degree of bilateral hydronephrosis. Patient was very hesitant to allow me to try to place a Foley catheter due to a bad experience with the nursing staff.  History of Present Illness: As above.  Past Medical History  Diagnosis Date  . No pertinent past medical history    Past Surgical History  Procedure Date  . No past surgeries     Medications:  Scheduled:   . amLODipine  5 mg Oral Daily  . darbepoetin (ARANESP) injection - DIALYSIS  60 mcg Subcutaneous Once  . pantoprazole (PROTONIX) IV  40 mg Intravenous Q12H  . sodium chloride  3 mL Intravenous Q12H    Allergies: No Known Allergies  History reviewed. No pertinent family history.  Social History:  reports that he has never smoked. He does not have any smokeless tobacco history on file. He reports that he does not drink alcohol or use illicit drugs.  Patient reports some leg edema with generalized fatigue. A weak stream at times with a sensation of incomplete emptying and at least some degree of dysuria. He denies any hematuria.  Physical Exam:  Vital signs in last 24 hours: Temp:  [97.5 F (36.4 C)-98.5 F (36.9 C)] 98.5 F (36.9 C) (07/26 1734) Pulse Rate:  [63-73] 71  (07/26 1734) Resp:  [18-20] 20  (07/26 1734) BP: (146-169)/(73-86) 169/86 mmHg (07/26 1734) SpO2:  [95 %-100 %] 100 % (07/26  1734) Weight:  [82.6 kg (182 lb 1.6 oz)] 82.6 kg (182 lb 1.6 oz) (07/25 2047)  Constitutional: Vital signs reviewed. WD WN in NAD Head: Normocephalic and atraumatic   Eyes: PERRL, No scleral icterus.  Neck: Supple No  Gross JVD, mass, thyromegaly, or carotid bruit present.  Cardiovascular: RRR Pulmonary/Chest: Normal effort Abdominal: Soft. Non-tender, non-distended, bowel sounds are normal, no masses, organomegaly, or guarding present. Moderate bladder distention noted Genitourinary: Patient has uncircumcised penis. Scrotum testes adnexal structures unremarkable. Patient completely refuses digital rectal exam. Extremities: No cyanosis or edema  Neurological: Grossly non-focal.  Skin: Warm,very dry and intact. No rash, cyanosis   Laboratory Data:  Results for orders placed during the hospital encounter of 06/23/12 (from the past 72 hour(s))  CBC     Status: Abnormal   Collection Time   06/23/12  3:11 PM      Component Value Range Comment   WBC 8.9  4.0 - 10.5 K/uL    RBC 2.93 (*) 4.22 - 5.81 MIL/uL    Hemoglobin 8.0 (*) 13.0 - 17.0 g/dL    HCT 29.5 (*) 28.4 - 52.0 %    MCV 78.5  78.0 - 100.0 fL    MCH 27.3  26.0 - 34.0 pg    MCHC 34.8  30.0 - 36.0 g/dL    RDW 13.2  44.0 - 10.2 %    Platelets 171  150 - 400 K/uL   BASIC METABOLIC PANEL     Status: Abnormal   Collection Time   06/23/12  3:11 PM  Component Value Range Comment   Sodium 139  135 - 145 mEq/L    Potassium 4.9  3.5 - 5.1 mEq/L    Chloride 101  96 - 112 mEq/L    CO2 24  19 - 32 mEq/L    Glucose, Bld 130 (*) 70 - 99 mg/dL    BUN 57 (*) 6 - 23 mg/dL    Creatinine, Ser 1.61 (*) 0.50 - 1.35 mg/dL    Calcium 9.7  8.4 - 09.6 mg/dL    GFR calc non Af Amer 10 (*) >90 mL/min    GFR calc Af Amer 11 (*) >90 mL/min   URINALYSIS, ROUTINE W REFLEX MICROSCOPIC     Status: Normal   Collection Time   06/23/12  4:17 PM      Component Value Range Comment   Color, Urine YELLOW  YELLOW    APPearance CLEAR  CLEAR    Specific  Gravity, Urine 1.006  1.005 - 1.030    pH 6.0  5.0 - 8.0    Glucose, UA NEGATIVE  NEGATIVE mg/dL    Hgb urine dipstick NEGATIVE  NEGATIVE    Bilirubin Urine NEGATIVE  NEGATIVE    Ketones, ur NEGATIVE  NEGATIVE mg/dL    Protein, ur NEGATIVE  NEGATIVE mg/dL    Urobilinogen, UA 0.2  0.0 - 1.0 mg/dL    Nitrite NEGATIVE  NEGATIVE    Leukocytes, UA NEGATIVE  NEGATIVE MICROSCOPIC NOT DONE ON URINES WITH NEGATIVE PROTEIN, BLOOD, LEUKOCYTES, NITRITE, OR GLUCOSE <1000 mg/dL.  TYPE AND SCREEN     Status: Normal   Collection Time   06/23/12  6:57 PM      Component Value Range Comment   ABO/RH(D) B POS      Antibody Screen NEG      Sample Expiration 06/26/2012     ABO/RH     Status: Normal   Collection Time   06/23/12  6:57 PM      Component Value Range Comment   ABO/RH(D) B POS     CK     Status: Abnormal   Collection Time   06/23/12  6:59 PM      Component Value Range Comment   Total CK 246 (*) 7 - 232 U/L   LACTIC ACID, PLASMA     Status: Normal   Collection Time   06/23/12  7:05 PM      Component Value Range Comment   Lactic Acid, Venous 1.0  0.5 - 2.2 mmol/L   OCCULT BLOOD, POC DEVICE     Status: Normal   Collection Time   06/23/12  7:37 PM      Component Value Range Comment   Fecal Occult Bld POSITIVE     HEPATIC FUNCTION PANEL     Status: Abnormal   Collection Time   06/23/12  9:08 PM      Component Value Range Comment   Total Protein 7.6  6.0 - 8.3 g/dL    Albumin 3.5  3.5 - 5.2 g/dL    AST 16  0 - 37 U/L    ALT 13  0 - 53 U/L    Alkaline Phosphatase 37 (*) 39 - 117 U/L    Total Bilirubin 0.3  0.3 - 1.2 mg/dL    Bilirubin, Direct <0.4  0.0 - 0.3 mg/dL    Indirect Bilirubin NOT CALCULATED  0.3 - 0.9 mg/dL   CBC     Status: Abnormal   Collection Time   06/23/12  9:08 PM  Component Value Range Comment   WBC 7.6  4.0 - 10.5 K/uL    RBC 2.71 (*) 4.22 - 5.81 MIL/uL    Hemoglobin 7.3 (*) 13.0 - 17.0 g/dL    HCT 96.0 (*) 45.4 - 52.0 %    MCV 78.2  78.0 - 100.0 fL    MCH 26.9   26.0 - 34.0 pg    MCHC 34.4  30.0 - 36.0 g/dL    RDW 09.8  11.9 - 14.7 %    Platelets 156  150 - 400 K/uL   PHOSPHORUS     Status: Abnormal   Collection Time   06/24/12  6:15 AM      Component Value Range Comment   Phosphorus 5.0 (*) 2.3 - 4.6 mg/dL   VITAMIN W29     Status: Normal   Collection Time   06/24/12  7:57 AM      Component Value Range Comment   Vitamin B-12 507  211 - 911 pg/mL   FOLATE     Status: Normal   Collection Time   06/24/12  7:57 AM      Component Value Range Comment   Folate >20.0     IRON AND TIBC     Status: Abnormal   Collection Time   06/24/12  7:57 AM      Component Value Range Comment   Iron 65  42 - 135 ug/dL    TIBC 562 (*) 130 - 865 ug/dL    Saturation Ratios 32  20 - 55 %    UIBC 138  125 - 400 ug/dL   FERRITIN     Status: Abnormal   Collection Time   06/24/12  7:57 AM      Component Value Range Comment   Ferritin 617 (*) 22 - 322 ng/mL   RETICULOCYTES     Status: Abnormal   Collection Time   06/24/12  7:57 AM      Component Value Range Comment   Retic Ct Pct 1.1  0.4 - 3.1 %    RBC. 2.57 (*) 4.22 - 5.81 MIL/uL    Retic Count, Manual 28.3  19.0 - 186.0 K/uL   CBC     Status: Abnormal   Collection Time   06/24/12  7:57 AM      Component Value Range Comment   WBC 6.7  4.0 - 10.5 K/uL    RBC 2.57 (*) 4.22 - 5.81 MIL/uL    Hemoglobin 7.1 (*) 13.0 - 17.0 g/dL    HCT 78.4 (*) 69.6 - 52.0 %    MCV 78.6  78.0 - 100.0 fL    MCH 27.6  26.0 - 34.0 pg    MCHC 35.1  30.0 - 36.0 g/dL    RDW 29.5  28.4 - 13.2 %    Platelets 136 (*) 150 - 400 K/uL   CARDIAC PANEL(CRET KIN+CKTOT+MB+TROPI)     Status: Normal   Collection Time   06/24/12  8:00 AM      Component Value Range Comment   Total CK 179  7 - 232 U/L    CK, MB 3.1  0.3 - 4.0 ng/mL    Troponin I <0.30  <0.30 ng/mL    Relative Index 1.7  0.0 - 2.5   HIV ANTIBODY (ROUTINE TESTING)     Status: Normal   Collection Time   06/24/12  9:30 AM      Component Value Range Comment   HIV NON REACTIVE   NON REACTIVE  HEPATITIS B SURFACE ANTIGEN     Status: Normal   Collection Time   06/24/12  9:30 AM      Component Value Range Comment   Hepatitis B Surface Ag NEGATIVE  NEGATIVE   PARATHYROID HORMONE, INTACT (NO CA)     Status: Abnormal   Collection Time   06/24/12  9:30 AM      Component Value Range Comment   PTH 211.4 (*) 14.0 - 72.0 pg/mL   COMPREHENSIVE METABOLIC PANEL     Status: Abnormal   Collection Time   06/25/12  6:00 AM      Component Value Range Comment   Sodium 139  135 - 145 mEq/L    Potassium 4.9  3.5 - 5.1 mEq/L    Chloride 104  96 - 112 mEq/L    CO2 23  19 - 32 mEq/L    Glucose, Bld 97  70 - 99 mg/dL    BUN 57 (*) 6 - 23 mg/dL    Creatinine, Ser 2.95 (*) 0.50 - 1.35 mg/dL    Calcium 8.8  8.4 - 62.1 mg/dL    Total Protein 7.2  6.0 - 8.3 g/dL    Albumin 3.3 (*) 3.5 - 5.2 g/dL    AST 18  0 - 37 U/L    ALT 12  0 - 53 U/L    Alkaline Phosphatase 38 (*) 39 - 117 U/L    Total Bilirubin 0.3  0.3 - 1.2 mg/dL    GFR calc non Af Amer 8 (*) >90 mL/min    GFR calc Af Amer 9 (*) >90 mL/min   CBC     Status: Abnormal   Collection Time   06/25/12  6:00 AM      Component Value Range Comment   WBC 5.7  4.0 - 10.5 K/uL    RBC 2.66 (*) 4.22 - 5.81 MIL/uL    Hemoglobin 7.2 (*) 13.0 - 17.0 g/dL    HCT 30.8 (*) 65.7 - 52.0 %    MCV 78.2  78.0 - 100.0 fL    MCH 27.1  26.0 - 34.0 pg    MCHC 34.6  30.0 - 36.0 g/dL    RDW 84.6  96.2 - 95.2 %    Platelets 152  150 - 400 K/uL    No results found for this or any previous visit (from the past 240 hour(s)). Creatinine:  Basename 06/25/12 0600 06/23/12 1511 06/23/12 1327  CREATININE 6.87* 5.88* 5.80*   Baseline Creatinine: Uncertain  Impression/Assessment:  Patient has acute renal failure. Unfortunately baseline renal status is unknown. 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 Foley catheter insertion was somewhat difficult but we were  successful with a coud catheter. A 1000 cc plus of clear urine obtained. It appears that this patient's renal failure is likely to be primarily obstructive in nature. He will need to be monitored for a postobstructive diuresis with careful monitoring of his electrolytes and fluid status. Certainly given his situation and the more advanced prostate cancer is a possibility. I do not see PSA data at this point but apparently it has been ordered. The patient refused to allow me to do a digital rectal exam to determine if he did have a indurated/nodular prostate. If PSA has not yet been obtained it certainly may be falsely elevated due to the catheter trauma. I would leave Foley catheter indwelling at this time. I would recommend that the  patient be started on Flomax. We will continue to follow his situation.  Plan:  As above.  Vikas Wegmann S 06/25/2012, 6:47 PM

## 2012-06-26 LAB — BASIC METABOLIC PANEL
BUN: 53 mg/dL — ABNORMAL HIGH (ref 6–23)
Creatinine, Ser: 5.97 mg/dL — ABNORMAL HIGH (ref 0.50–1.35)
GFR calc Af Amer: 11 mL/min — ABNORMAL LOW (ref >90)
GFR calc non Af Amer: 9 mL/min — ABNORMAL LOW (ref >90)
Potassium: 5.4 mEq/L — ABNORMAL HIGH (ref 3.5–5.1)

## 2012-06-26 MED ORDER — TRAMADOL HCL 50 MG PO TABS
100.0000 mg | ORAL_TABLET | Freq: Two times a day (BID) | ORAL | Status: DC | PRN
  Administered 2012-06-26: 100 mg via ORAL
  Filled 2012-06-26 (×2): qty 2

## 2012-06-26 MED ORDER — TRAMADOL HCL 50 MG PO TABS: 100.0000 mg | ORAL_TABLET | Freq: Four times a day (QID) | ORAL | Status: DC | PRN

## 2012-06-26 MED ORDER — TRAMADOL HCL 50 MG PO TABS
50.0000 mg | ORAL_TABLET | Freq: Four times a day (QID) | ORAL | Status: DC | PRN
  Administered 2012-06-26: 50 mg via ORAL
  Filled 2012-06-26: qty 1

## 2012-06-26 NOTE — Progress Notes (Signed)
[facility] ROUNDING NOTE   Subjective:   Interval History: none foley draining  Objective:  Vital signs in last 24 hours:  Temp:  [97.8 F (36.6 C)-98.5 F (36.9 C)] 97.9 F (36.6 C) (07/27 0550) Pulse Rate:  [70-85] 77  (07/27 0550) Resp:  [20] 20  (07/27 0550) BP: (146-186)/(73-99) 172/99 mmHg (07/27 0550) SpO2:  [95 %-100 %] 100 % (07/27 0550) Weight:  [82.6 kg (182 lb 1.6 oz)] 82.6 kg (182 lb 1.6 oz) (07/26 2056)  Weight change: 0 kg (0 lb) Filed Weights   06/23/12 2154 06/24/12 2047 06/25/12 2056  Weight: 82.6 kg (182 lb 1.6 oz) 82.6 kg (182 lb 1.6 oz) 82.6 kg (182 lb 1.6 oz)    Intake/Output: I/O last 3 completed shifts: In: 2880.7 [P.O.:2720; I.V.:160.7] Out: 6100 [Urine:6100]   Intake/Output this shift:     CVS- RRR RS- CTA ABD- BS present soft non-distended EXT- no edema   Basic Metabolic Panel:  Lab 06/25/12 1610 06/24/12 0615 06/23/12 1511 06/23/12 1327  NA 139 -- 139 140  K 4.9 -- 4.9 5.3*  CL 104 -- 101 105  CO2 23 -- 24 --  GLUCOSE 97 -- 130* 107*  BUN 57* -- 57* 58*  CREATININE 6.87* -- 5.88* 5.80*  CALCIUM 8.8 -- 9.7 --  MG -- -- -- --  PHOS -- 5.0* -- --    Liver Function Tests:  Lab 06/25/12 0600 06/23/12 2108  AST 18 16  ALT 12 13  ALKPHOS 38* 37*  BILITOT 0.3 0.3  PROT 7.2 7.6  ALBUMIN 3.3* 3.5   No results found for this basename: LIPASE:5,AMYLASE:5 in the last 168 hours No results found for this basename: AMMONIA:3 in the last 168 hours  CBC:  Lab 06/25/12 0600 06/24/12 0757 06/23/12 2108 06/23/12 1511 06/23/12 1327  WBC 5.7 6.7 7.6 8.9 --  NEUTROABS -- -- -- -- --  HGB 7.2* 7.1* 7.3* 8.0* 8.8*  HCT 20.8* 20.2* 21.2* 23.0* 26.0*  MCV 78.2 78.6 78.2 78.5 --  PLT 152 136* 156 171 --    Cardiac Enzymes:  Lab 06/24/12 0800 06/23/12 1859  CKTOTAL 179 246*  CKMB 3.1 --  CKMBINDEX -- --  TROPONINI <0.30 --    BNP: No components found with this basename: POCBNP:5  CBG: No results found for this  basename: GLUCAP:5 in the last 168 hours  Microbiology: No results found for this or any previous visit.  Coagulation Studies: No results found for this basename: LABPROT:5,INR:5 in the last 72 hours  Urinalysis:  Basename 06/23/12 1617  COLORURINE YELLOW  LABSPEC 1.006  PHURINE 6.0  GLUCOSEU NEGATIVE  HGBUR NEGATIVE  BILIRUBINUR NEGATIVE  KETONESUR NEGATIVE  PROTEINUR NEGATIVE  UROBILINOGEN 0.2  NITRITE NEGATIVE  LEUKOCYTESUR NEGATIVE      Imaging: US Renal  06/24/2012  *RADIOLOGY REPORT*  Clinical Data: Renal failure.  RENAL/URINARY TRACT ULTRASOUND COMPLETE  Comparison:  None.  Findings:  Right Kidney:  The right kidney measures 10.6 cm in length with slightly increased echotexture.  There is mild/moderate right hydronephrosis.  Left Kidney:  The left kidney measures 11.3 cm in length.  There is mild/moderate left hydronephrosis.  Bladder:  The urinary bladder is filled with urine.  There are bilateral ureteral jets.  IMPRESSION: Mild/moderate bilateral hydronephrosis.  The urinary bladder is distended and there are bilateral ureteral jets.  Findings raise concern for urinary bladder retention and/or bladder outlet obstruction.  Original Report Authenticated By: Richarda Overlie, M.D.     Medications:      .  sodium chloride 20 mL/hr at 06/24/12 0730      . amLODipine  5 mg Oral Daily  . darbepoetin (ARANESP) injection - DIALYSIS  60 mcg Subcutaneous Once  . pantoprazole (PROTONIX) IV  40 mg Intravenous Q12H  . sodium chloride  3 mL Intravenous Q12H   acetaminophen, acetaminophen, hydrALAZINE, ondansetron (ZOFRAN) IV, ondansetron  Assessment/ Plan:  56 year-old male with no significant past medical history started noticing that his both lower extremities beginning swollen over the last 2 days.metabolic panel showed elevated creatinine, hemoglobin around 8.Stool for occult blood was positive in the ER .  Renal Failure- may have element of obstruction  HTN improved    Anemia will check Iron stores  Bones check PTH   Access planning will check vein mapping and refer to [facility]    Creatinine pending     LOS: 3 Labrenda Lasky W @TODAY @8 :51 AM

## 2012-06-26 NOTE — Progress Notes (Signed)
[facility] PROGRESS NOTE  Manuel Lucas WUJ:[id number] DOB: 04-30-1956 DOA: 06/23/2012 PCP: Sheila Oats, MD  Assessment/Plan: Principal Problem:  *Renal failure Active Problems:  Anemia  Elevated blood pressure  1. Renal Failure:  - Renal ultrasound interpreted as mild/moderate BL hydronephrosis. Urinary bladder is distended and there are bilateral ureteral jets. Concern for urinary bladder retention and /or bladder outlet obstruction.  - Nephrology and Urology on board. - Urology placed foley catheter yesterday and reportedly patient had > 1.5 Liter diuresis overnight with significant output this am as well. - Placed consult for Urology to evaluate patient and help place foley given that nurses were unable to. As well as provide recommendations from their standpoint given current clinical scenario.   2. Anemia  - Stable with no visible active bleeding today  - Anemia panel reviewed   3. Blood pressure  - Not well controlled amlodipine added yesterday.  Considering increasing dose should blood pressures remain elevated.  Code Status: Full  Family Communication: None at bedside  Disposition Plan: Pending clinical improvement   Brief narrative:  Patient is a 56 y/o with no prior follow up with a PCP that presented to the ED complaining of increased edema BL in his legs. Upon further work-up patient was found to have an elevated creatinine and he was admitted for further evaluation and recommendations. Nephrology was called for further evaluation and recommendations. After Renal U/S was obtained which showed BL mild/moderate hydronephrosis and other findings it was recommended to place foley. Nursing tried two times to place foley but were unsuccessful. Subsequently Urology was called for further recommendations.   Consultants:  Nephrology Urology Procedures:  None currently Antibiotics:  None  HPI/Subjective: Patient has discomfort at penis today reportedly.  Tylenol  did not help with the discomfort.  I added pain medication today which has helped.  No acute issues reported overnight besides large output after foley was placed.  Objective: Filed Vitals:   06/25/12 2056 06/26/12 0550 06/26/12 0915 06/26/12 1335  BP: 186/92 172/99 164/91 174/100  Pulse: 85 77 72 78  Temp: 98.4 F (36.9 C) 97.9 F (36.6 C) 97.9 F (36.6 C) 98.1 F (36.7 C)  TempSrc: Oral Oral Oral Oral  Resp: 20 20 20 18   Height: 5\' 9"  (1.753 m)     Weight: 82.6 kg (182 lb 1.6 oz)     SpO2: 100% 100% 100% 98%    Intake/Output Summary (Last 24 hours) at 06/26/12 1421 Last data filed at 06/26/12 1300  Gross per 24 hour  Intake   1640 ml  Output   6200 ml  Net  -4560 ml    Exam:  General: Pt in NAD, A and O x 3  Cardiovascular: RRR, No MRG  Respiratory: CTA BL, no wheezes  Abdomen: Soft, NT, ND GU: Foley in place. No active bleeding noted at catheter insertion site.  Data Reviewed: Basic Metabolic Panel:  Lab 06/26/12 9562 06/25/12 0600 06/24/12 0615 06/23/12 1511 06/23/12 1327  NA 139 139 -- 139 140  K 5.4* 4.9 -- 4.9 5.3*  CL 102 104 -- 101 105  CO2 24 23 -- 24 --  GLUCOSE 127* 97 -- 130* 107*  BUN 53* 57* -- 57* 58*  CREATININE 5.97* 6.87* -- 5.88* 5.80*  CALCIUM 9.1 8.8 -- 9.7 --  MG -- -- -- -- --  PHOS -- -- 5.0* -- --   Liver Function Tests:  Lab 06/25/12 0600 06/23/12 2108  AST 18 16  ALT 12 13  ALKPHOS 38* 37*  BILITOT 0.3 0.3  PROT 7.2 7.6  ALBUMIN 3.3* 3.5   No results found for this basename: LIPASE:5,AMYLASE:5 in the last 168 hours No results found for this basename: AMMONIA:5 in the last 168 hours CBC:  Lab 06/25/12 0600 06/24/12 0757 06/23/12 2108 06/23/12 1511 06/23/12 1327  WBC 5.7 6.7 7.6 8.9 --  NEUTROABS -- -- -- -- --  HGB 7.2* 7.1* 7.3* 8.0* 8.8*  HCT 20.8* 20.2* 21.2* 23.0* 26.0*  MCV 78.2 78.6 78.2 78.5 --  PLT 152 136* 156 171 --   Cardiac Enzymes:  Lab 06/24/12 0800 06/23/12 1859  CKTOTAL 179 246*  CKMB 3.1 --    CKMBINDEX -- --  TROPONINI <0.30 --   BNP (last 3 results) No results found for this basename: PROBNP:3 in the last 8760 hours CBG: No results found for this basename: GLUCAP:5 in the last 168 hours  No results found for this or any previous visit (from the past 240 hour(s)).   Studies: Dg Chest 2 View  06/23/2012  *RADIOLOGY REPORT*  Clinical Data: Acute renal failure  CHEST - 2 VIEW  Comparison: None.  Findings: Normal heart size.  Lungs are clear.  No pneumothorax and no pleural effusion.  IMPRESSION: No active cardiopulmonary disease.  Original Report Authenticated By: Donavan Burnet, M.D.   US Renal  06/24/2012  *RADIOLOGY REPORT*  Clinical Data: Renal failure.  RENAL/URINARY TRACT ULTRASOUND COMPLETE  Comparison:  None.  Findings:  Right Kidney:  The right kidney measures 10.6 cm in length with slightly increased echotexture.  There is mild/moderate right hydronephrosis.  Left Kidney:  The left kidney measures 11.3 cm in length.  There is mild/moderate left hydronephrosis.  Bladder:  The urinary bladder is filled with urine.  There are bilateral ureteral jets.  IMPRESSION: Mild/moderate bilateral hydronephrosis.  The urinary bladder is distended and there are bilateral ureteral jets.  Findings raise concern for urinary bladder retention and/or bladder outlet obstruction.  Original Report Authenticated By: Richarda Overlie, M.D.    Scheduled Meds:   . amLODipine  5 mg Oral Daily  . pantoprazole (PROTONIX) IV  40 mg Intravenous Q12H  . sodium chloride  3 mL Intravenous Q12H   Continuous Infusions:   . sodium chloride 20 mL/hr at 06/24/12 0730    Principal Problem:  *Renal failure Active Problems:  Anemia  Elevated blood pressure    Time spent: > 35 minutes    Penny Pia  [facility] Pager [phone] If 8PM-8AM, please contact night-coverage at [email or web address], password [facility] 06/26/2012, 2:21 PM  LOS: 3 days

## 2012-06-26 NOTE — Progress Notes (Signed)
Patient ID: Manuel Lucas, male   DOB: Jan 05, 1956, 56 y.o.   MRN: [id number]   Subjective: Patient reports penile discomfort from his indwelling catheter. He has had substantial of diuresed is status post a Foley catheter insertion. Patient's PSA was drawn prior to Foley manipulation and found to be over 100. The patient refused rectal examination consultation and again refuses that today. Objective: Vital signs in last 24 hours: Temp:  [97.9 F (36.6 C)-98.5 F (36.9 C)] 97.9 F (36.6 C) (07/27 0915) Pulse Rate:  [71-85] 72  (07/27 0915) Resp:  [20] 20  (07/27 0915) BP: (151-186)/(85-99) 164/91 mmHg (07/27 0915) SpO2:  [100 %] 100 % (07/27 0915) Weight:  [82.6 kg (182 lb 1.6 oz)] 82.6 kg (182 lb 1.6 oz) (07/26 2056)  Intake/Output from previous day: 07/26 0701 - 07/27 0700 In: 1520 [P.O.:1520] Out: 5200 [Urine:5200] Intake/Output this shift: Total I/O In: 240 [P.O.:240] Out: -   Physical Exam:  Constitutional: Vital signs reviewed. WD WN in NAD   Eyes: PERRL, No scleral icterus.   Cardiovascular: RRR Pulmonary/Chest: Normal effort Abdominal: Soft. Non-tender, non-distended, bowel sounds are normal, no masses, organomegaly, or guarding present.  Genitourinary: Patient refused rectal exam. Foley catheter indwelling draining clear urine Extremities: No cyanosis or edema   Lab Results:  Basename 06/25/12 0600 06/24/12 0757 06/23/12 2108  HGB 7.2* 7.1* 7.3*  HCT 20.8* 20.2* 21.2*   BMET  Basename 06/26/12 1124 06/25/12 0600  NA 139 139  K 5.4* 4.9  CL 102 104  CO2 24 23  GLUCOSE 127* 97  BUN 53* 57*  CREATININE 5.97* 6.87*  CALCIUM 9.1 8.8   No results found for this basename: LABPT:3,INR:3 in the last 72 hours No results found for this basename: LABURIN:1 in the last 72 hours No results found for this or any previous visit.  Studies/Results: No results found.  Assessment/Plan:   *Patient is had a significant diuresis with the indwelling catheter his creatinine  has finally improved somewhat. Hopefully this will continue to improve. The patient clearly has at least a component of obstructive uropathy as a cause for his acute renal failure this may be long-standing and I suspect this will not normalize. Foley insertion was quite difficult. His PSA is substantially elevated I certainly am quite concerned about locally advanced and potentially metastatic prostate cancer. We are hindered quite a bit by the patient's refusal of a rectal exam. He is probably going to refused prostate biopsy but will have to talk with him going forward. In the interim I do think it would be a good idea to order a bone scan to see if there is any evidence of metastatic disease. His Foley catheter has to stay indwelling and hopefully his renal function will improve over the next several days. Going forward I think this is going to be a difficult case to manage I given the patient's significant medical issues and also refusal really to cooperate with certain aspects of his care.   LOS: 3 days   Jondavid Schreier S 06/26/2012, 1:17 PM

## 2012-06-27 ENCOUNTER — Encounter (HOSPITAL_COMMUNITY): Payer: Self-pay

## 2012-06-27 ENCOUNTER — Inpatient Hospital Stay (HOSPITAL_COMMUNITY): Payer: Self-pay

## 2012-06-27 DIAGNOSIS — N139 Obstructive and reflux uropathy, unspecified: Secondary | ICD-10-CM | POA: Diagnosis present

## 2012-06-27 LAB — CBC
MCH: 27.8 pg (ref 26.0–34.0)
MCHC: 35.6 g/dL (ref 30.0–36.0)
Platelets: 196 10*3/uL (ref 150–400)
RDW: 14.8 % (ref 11.5–15.5)

## 2012-06-27 LAB — RENAL FUNCTION PANEL
Albumin: 3.6 g/dL (ref 3.5–5.2)
Calcium: 9.6 mg/dL (ref 8.4–10.5)
Creatinine, Ser: 5.53 mg/dL — ABNORMAL HIGH (ref 0.50–1.35)
GFR calc non Af Amer: 10 mL/min — ABNORMAL LOW (ref >90)

## 2012-06-27 MED ORDER — TECHNETIUM TC 99M MEDRONATE IV KIT
25.0000 | PACK | Freq: Once | INTRAVENOUS | Status: AC | PRN
  Administered 2012-06-27: 25 via INTRAVENOUS

## 2012-06-27 MED ORDER — OXYCODONE HCL 5 MG PO TABS
5.0000 mg | ORAL_TABLET | ORAL | Status: DC | PRN
  Administered 2012-06-28: 5 mg via ORAL
  Filled 2012-06-27: qty 1

## 2012-06-27 MED ORDER — MORPHINE SULFATE 2 MG/ML IJ SOLN
1.0000 mg | INTRAMUSCULAR | Status: DC | PRN
  Administered 2012-06-27: 1 mg via INTRAVENOUS
  Filled 2012-06-27: qty 1

## 2012-06-27 MED ORDER — AMLODIPINE BESYLATE 10 MG PO TABS
10.0000 mg | ORAL_TABLET | Freq: Every day | ORAL | Status: DC
  Administered 2012-06-28 – 2012-07-14 (×15): 10 mg via ORAL
  Filled 2012-06-27 (×18): qty 1

## 2012-06-27 MED ORDER — OXYCODONE HCL 5 MG PO TABS
5.0000 mg | ORAL_TABLET | ORAL | Status: DC | PRN
  Administered 2012-06-27 (×2): 5 mg via ORAL
  Filled 2012-06-27 (×2): qty 1

## 2012-06-27 MED ORDER — PANTOPRAZOLE SODIUM 40 MG PO TBEC
40.0000 mg | DELAYED_RELEASE_TABLET | Freq: Two times a day (BID) | ORAL | Status: DC
  Administered 2012-06-28 – 2012-07-14 (×30): 40 mg via ORAL
  Filled 2012-06-27 (×34): qty 1

## 2012-06-27 NOTE — Progress Notes (Signed)
Patient's refused to have a bone biopsy done as per technician,i wen into his room to talk  about the procedure,the purpose and how it will help him ."im going to think about it,im not a  [place] pig " as patient verbalized .

## 2012-06-27 NOTE — Progress Notes (Signed)
[facility] ROUNDING NOTE   Subjective:   Interval History: complains of pain in groin and back. PSA 106!!  Objective:  Vital signs in last 24 hours:  Temp:  [97.8 F (36.6 C)-98.1 F (36.7 C)] 97.8 F (36.6 C) (07/28 0532) Pulse Rate:  [78-98] 98  (07/28 0532) Resp:  [16-18] 16  (07/28 0532) BP: (130-174)/(87-109) 153/88 mmHg (07/28 0532) SpO2:  [94 %-98 %] 96 % (07/28 0532) Weight:  [80 kg (176 lb 5.9 oz)] 80 kg (176 lb 5.9 oz) (07/27 2153)  Weight change: -2.6 kg (-5 lb 11.7 oz) Filed Weights   06/24/12 2047 06/25/12 2056 06/26/12 2153  Weight: 82.6 kg (182 lb 1.6 oz) 82.6 kg (182 lb 1.6 oz) 80 kg (176 lb 5.9 oz)    Intake/Output: I/O last 3 completed shifts: In: 1920 [P.O.:1920] Out: 6550 [Urine:6550]   Intake/Output this shift:     CVS- RRR RS- CTA ABD- BS present soft non-distended EXT- no edema   Basic Metabolic Panel:  Lab 06/27/12 7829 06/26/12 1124 06/25/12 0600 06/24/12 0615 06/23/12 1511 06/23/12 1327  NA 137 139 139 -- 139 140  K 5.2* 5.4* 4.9 -- 4.9 5.3*  CL 100 102 104 -- 101 105  CO2 22 24 23  -- 24 --  GLUCOSE 120* 127* 97 -- 130* 107*  BUN 50* 53* 57* -- 57* 58*  CREATININE 5.53* 5.97* 6.87* -- 5.88* 5.80*  CALCIUM 9.6 9.1 8.8 -- -- --  MG -- -- -- -- -- --  PHOS 5.9* -- -- 5.0* -- --    Liver Function Tests:  Lab 06/27/12 0600 06/25/12 0600 06/23/12 2108  AST -- 18 16  ALT -- 12 13  ALKPHOS -- 38* 37*  BILITOT -- 0.3 0.3  PROT -- 7.2 7.6  ALBUMIN 3.6 3.3* 3.5   No results found for this basename: LIPASE:5,AMYLASE:5 in the last 168 hours No results found for this basename: AMMONIA:3 in the last 168 hours  CBC:  Lab 06/27/12 0600 06/25/12 0600 06/24/12 0757 06/23/12 2108 06/23/12 1511  WBC 9.8 5.7 6.7 7.6 8.9  NEUTROABS -- -- -- -- --  HGB 9.3* 7.2* 7.1* 7.3* 8.0*  HCT 26.1* 20.8* 20.2* 21.2* 23.0*  MCV 77.9* 78.2 78.6 78.2 78.5  PLT 196 152 136* 156 171    Cardiac Enzymes:  Lab 06/24/12 0800 06/23/12 1859    CKTOTAL 179 246*  CKMB 3.1 --  CKMBINDEX -- --  TROPONINI <0.30 --    BNP: No components found with this basename: POCBNP:5  CBG: No results found for this basename: GLUCAP:5 in the last 168 hours  Microbiology: No results found for this or any previous visit.  Coagulation Studies: No results found for this basename: LABPROT:5,INR:5 in the last 72 hours  Urinalysis: No results found for this basename: COLORURINE:2,APPERANCEUR:2,LABSPEC:2,PHURINE:2,GLUCOSEU:2,HGBUR:2,BILIRUBINUR:2,KETONESUR:2,PROTEINUR:2,UROBILINOGEN:2,NITRITE:2,LEUKOCYTESUR:2 in the last 72 hours    Imaging: No results found.   Medications:      . sodium chloride 20 mL/hr at 06/24/12 0730      . amLODipine  5 mg Oral Daily  . pantoprazole (PROTONIX) IV  40 mg Intravenous Q12H  . sodium chloride  3 mL Intravenous Q12H   acetaminophen, acetaminophen, hydrALAZINE, ondansetron (ZOFRAN) IV, ondansetron, traMADol, DISCONTD: traMADol, DISCONTD: traMADol  Assessment/ Plan:  56 year-old male with no significant past medical history started noticing that his both lower extremities beginning swollen over the last 2 days.metabolic panel showed elevated creatinine, hemoglobin around 8.Stool for occult blood was positive in the ER  .  Renal  Failure- may have element of obstruction although may still have advanced renal disease. Bone scan planned  HTN improved   Anemia improved will follow  Bones PTH over 200 suggests Chronic renal disease  Access planning will check vein mapping and refer to [facility]. Patient is hesitant about dialysis   LOS: 4 Manuel Lucas W @TODAY @9 :19 AM

## 2012-06-27 NOTE — Progress Notes (Signed)
Patient ID: Manuel Lucas, male   DOB: 04/02/1956, 56 y.o.   MRN: [id number]   Subjective: Patient is to undergo bone scan imaging today. His creatinine continues to slowly improve. 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 This appears to be likely secondary to outlet obstruction from locally advanced prostate cancer. Again this is based on PSA data as the patient has been adamant in refusing rectal exam. The patient is quite difficult in that he does not cooperate well.   Objective: Vital signs in last 24 hours: Temp:  [97.8 F (36.6 C)-98.1 F (36.7 C)] 97.9 F (36.6 C) (07/28 1000) Pulse Rate:  [70-98] 70  (07/28 1000) Resp:  [16-18] 18  (07/28 1000) BP: (130-174)/(87-109) 151/88 mmHg (07/28 1000) SpO2:  [94 %-98 %] 97 % (07/28 1000) Weight:  [80 kg (176 lb 5.9 oz)] 80 kg (176 lb 5.9 oz) (07/27 2153)  Intake/Output from previous day: 07/27 0701 - 07/28 0700 In: 960 [P.O.:960] Out: 3050 [Urine:3050] Intake/Output this shift: Total I/O In: 240 [P.O.:240] Out: -   Physical Exam:  Constitutional: Vital signs reviewed. WD WN in NAD   Eyes: PERRL, No scleral icterus.   Cardiovascular: RRR Pulmonary/Chest: Normal effort Abdominal: Soft. Non-tender, non-distended, bowel sounds are normal, no masses, organomegaly, or guarding present.  Genitourinary: Indwelling Foley. Extremities: No cyanosis or edema   Lab Results:  Basename 06/27/12 0600 06/25/12 0600  HGB 9.3* 7.2*  HCT 26.1* 20.8*   BMET  Basename 06/27/12 0600 06/26/12 1124  NA 137 139  K 5.2* 5.4*  CL 100 102  CO2 22 24  GLUCOSE 120* 127*  BUN 50* 53*  CREATININE 5.53* 5.97*  CALCIUM 9.6 9.1   No results found for this basename: LABPT:3,INR:3 in the last 72 hours No results found for this basename: LABURIN:1 in the last 72 hours No results found for this or any previous visit.  Studies/Results: No results found.  Assessment/Plan:   Mr. Slyter is likely to  have locally advanced and possibly metastatic prostate cancer. Bone scan pending for staging purposes. The patient needs to continue with an indwelling Foley catheter and hopefully his creatinine will slowly improve. He would be unlikely that his renal function would normalize. The greatest difficulty going forward is going to be in establishing the diagnosis patient is quite uncooperative with going ahead with the standard recommendations. I suspect is going to be very difficult to get him to come back to our office for any type of prostate biopsy. Ultimately he may be best managed with a TURP to both hopefully eliminate the local obstruction as well as to obtain tissue off for pathologic analysis.. If the patient has metastatic disease and he would need to be treated with chemical castration. Again this is can be difficult given the patient's current reluctance for treatment as well as his lack of insurance. If no metastatic disease is noted then he may be a candidate for some local radiation therapy. We will continue to discuss these issues with him going forward.   LOS: 4 days   Fynley Chrystal S 06/27/2012, 10:38 AM

## 2012-06-27 NOTE — Progress Notes (Signed)
[facility] PROGRESS NOTE  Manuel Lucas ZOX:[id number] DOB: [birth year] DOA: 06/23/2012 PCP: Sheila Oats, MD  Assessment/Plan: Principal Problem:  *Renal failure Active Problems:  Anemia  Elevated blood pressure  1. Renal Failure - Likely secondary to long standing obstructive uropathy. - Very much appreciate Nephrology's and Urology's recommendations - I have taken a long time today explaining current condition and suspected diagnoses.  Patient is agreeable to get further work up with bone biopsy.  He is pretty concerned about side effects (mainly impotence) related to treatments for prostate cancer. - Creatinine improved and I will follow up with Nephrology's and Urology's recommendations.  2. Anemia  - Stable with no visible active bleeding today  - Anemia panel reviewed  - Hemoglobin improved today from 7.2 to 9.3 - There is some hematuria noted in foley bag today  3. Blood pressure  - Not well controlled amlodipine added yesterday.  - Will increase amlodipine dose today.  Code Status: Full  Family Communication: None at bedside  Disposition Plan: Pending clinical improvement   Brief narrative:  Patient is a 56 y/o with no prior follow up with a PCP that presented to the ED complaining of increased edema BL in his legs. Upon further work-up patient was found to have an elevated creatinine and he was admitted for further evaluation and recommendations. Nephrology was called for further evaluation and recommendations. After Renal U/S was obtained which showed BL mild/moderate hydronephrosis and other findings it was recommended to place foley. Nursing tried two times to place foley but were unsuccessful. Subsequently Urology was called for further recommendations. PSA came back really elevated at 106 and bone scan ordered.  Patient likely has advanced prostate cancer.    Consultants:  Nephrology  Urology Procedures:  None currently Antibiotics:   None   HPI/Subjective: Patient and I had long talk.  I explained in detail the though process behind the orders that were being placed by the specialist.  He seems to be worried about being impotent if he proceeds with treatment.  I have explained to him that if he were to not get any help his kidneys could get further injury and may lead up to worsening of his condition including death.  Patient has agreed to proceed with further testing at this juncture after our discussion.  His only complaint is some discomfort at the foley catheter insertion site.  Denies any fever, chills, chest pain, nausea, emesis, or abdominal discomfort.  Objective: Filed Vitals:   06/26/12 2153 06/27/12 0532 06/27/12 1000 06/27/12 1400  BP: 130/87 153/88 151/88 150/99  Pulse: 94 98 70 86  Temp: 98.1 F (36.7 C) 97.8 F (36.6 C) 97.9 F (36.6 C) 97.8 F (36.6 C)  TempSrc: Oral Oral Oral Oral  Resp: 18 16 18 19   Height:      Weight: 80 kg (176 lb 5.9 oz)     SpO2: 94% 96% 97% 95%    Intake/Output Summary (Last 24 hours) at 06/27/12 1434 Last data filed at 06/27/12 1300  Gross per 24 hour  Intake    960 ml  Output   1250 ml  Net   -290 ml    Exam:  General: Pt in NAD, A and O x 3  Cardiovascular: RRR, No MRG  Respiratory: CTA BL, no wheezes  Abdomen: Soft, NT, ND  GU: Foley in place. No active bleeding noted at catheter insertion site.  Data Reviewed: Basic Metabolic Panel:  Lab 06/27/12 8119 06/26/12 1124 06/25/12 0600 06/24/12 0615 06/23/12 1511 06/23/12  1327  NA 137 139 139 -- 139 140  K 5.2* 5.4* 4.9 -- 4.9 5.3*  CL 100 102 104 -- 101 105  CO2 22 24 23  -- 24 --  GLUCOSE 120* 127* 97 -- 130* 107*  BUN 50* 53* 57* -- 57* 58*  CREATININE 5.53* 5.97* 6.87* -- 5.88* 5.80*  CALCIUM 9.6 9.1 8.8 -- 9.7 --  MG -- -- -- -- -- --  PHOS 5.9* -- -- 5.0* -- --   Liver Function Tests:  Lab 06/27/12 0600 06/25/12 0600 06/23/12 2108  AST -- 18 16  ALT -- 12 13  ALKPHOS -- 38* 37*  BILITOT --  0.3 0.3  PROT -- 7.2 7.6  ALBUMIN 3.6 3.3* 3.5   No results found for this basename: LIPASE:5,AMYLASE:5 in the last 168 hours No results found for this basename: AMMONIA:5 in the last 168 hours CBC:  Lab 06/27/12 0600 06/25/12 0600 06/24/12 0757 06/23/12 2108 06/23/12 1511  WBC 9.8 5.7 6.7 7.6 8.9  NEUTROABS -- -- -- -- --  HGB 9.3* 7.2* 7.1* 7.3* 8.0*  HCT 26.1* 20.8* 20.2* 21.2* 23.0*  MCV 77.9* 78.2 78.6 78.2 78.5  PLT 196 152 136* 156 171   Cardiac Enzymes:  Lab 06/24/12 0800 06/23/12 1859  CKTOTAL 179 246*  CKMB 3.1 --  CKMBINDEX -- --  TROPONINI <0.30 --   BNP (last 3 results) No results found for this basename: PROBNP:3 in the last 8760 hours CBG: No results found for this basename: GLUCAP:5 in the last 168 hours  No results found for this or any previous visit (from the past 240 hour(s)).   Studies: Dg Chest 2 View  06/23/2012  *RADIOLOGY REPORT*  Clinical Data: Acute renal failure  CHEST - 2 VIEW  Comparison: None.  Findings: Normal heart size.  Lungs are clear.  No pneumothorax and no pleural effusion.  IMPRESSION: No active cardiopulmonary disease.  Original Report Authenticated By: Donavan Burnet, M.D.   Nm Bone Scan Whole Body  06/27/2012  *RADIOLOGY REPORT*  Clinical Data: Elevated PSA.  NUCLEAR MEDICINE WHOLE BODY BONE SCINTIGRAPHY  Technique:  Whole body anterior and posterior images were obtained approximately 3 hours after intravenous injection of radiopharmaceutical.  Radiopharmaceutical: CURIE TC-MDP TECHNETIUM TC 84M MEDRONATE IV KIT  Comparison: ultrasound 06/24/2012.  Findings: There are mild degenerative changes but no findings suspicious for osseous metastatic disease.  Mild bilateral hydronephrosis is suspected.  IMPRESSION: Negative bone scan for osseous metastatic disease.  Original Report Authenticated By: P. Loralie Champagne, M.D.   US Renal  06/24/2012  *RADIOLOGY REPORT*  Clinical Data: Renal failure.  RENAL/URINARY TRACT ULTRASOUND  COMPLETE  Comparison:  None.  Findings:  Right Kidney:  The right kidney measures 10.6 cm in length with slightly increased echotexture.  There is mild/moderate right hydronephrosis.  Left Kidney:  The left kidney measures 11.3 cm in length.  There is mild/moderate left hydronephrosis.  Bladder:  The urinary bladder is filled with urine.  There are bilateral ureteral jets.  IMPRESSION: Mild/moderate bilateral hydronephrosis.  The urinary bladder is distended and there are bilateral ureteral jets.  Findings raise concern for urinary bladder retention and/or bladder outlet obstruction.  Original Report Authenticated By: Richarda Overlie, M.D.    Scheduled Meds:   . amLODipine  5 mg Oral Daily  . pantoprazole (PROTONIX) IV  40 mg Intravenous Q12H  . sodium chloride  3 mL Intravenous Q12H   Continuous Infusions:   . sodium chloride 20 mL/hr at 06/24/12 0730  Principal Problem:  *Renal failure Active Problems:  Anemia  Elevated blood pressure    Time spent: > 40 minutes discussing with patient current condition, medical decision making, placing orders, documenting.    Penny Pia  [facility] Pager [phone] If 8PM-8AM, please contact night-coverage at [email or web address], password [facility] 06/27/2012, 2:34 PM  LOS: 4 days

## 2012-06-28 ENCOUNTER — Inpatient Hospital Stay (HOSPITAL_COMMUNITY): Payer: Self-pay

## 2012-06-28 DIAGNOSIS — N139 Obstructive and reflux uropathy, unspecified: Secondary | ICD-10-CM

## 2012-06-28 LAB — RENAL FUNCTION PANEL
Calcium: 9.6 mg/dL (ref 8.4–10.5)
GFR calc Af Amer: 12 mL/min — ABNORMAL LOW (ref >90)
GFR calc non Af Amer: 11 mL/min — ABNORMAL LOW (ref >90)
Glucose, Bld: 120 mg/dL — ABNORMAL HIGH (ref 70–99)
Phosphorus: 5.1 mg/dL — ABNORMAL HIGH (ref 2.3–4.6)
Sodium: 135 mEq/L (ref 135–145)

## 2012-06-28 LAB — HEPATITIS C VRS RNA DETECT BY PCR-QUAL: Hepatitis C Vrs RNA by PCR-Qual: NEGATIVE

## 2012-06-28 MED ORDER — SODIUM POLYSTYRENE SULFONATE 15 GM/60ML PO SUSP
15.0000 g | Freq: Once | ORAL | Status: AC
  Administered 2012-06-28: 15 g via ORAL
  Filled 2012-06-28: qty 60

## 2012-06-28 MED ORDER — SODIUM CHLORIDE 0.9 % IV SOLN: 250.0000 mL | INTRAVENOUS | Status: DC | PRN

## 2012-06-28 MED ORDER — SODIUM CHLORIDE 0.9 % IJ SOLN
3.0000 mL | Freq: Two times a day (BID) | INTRAMUSCULAR | Status: DC
  Administered 2012-06-30 – 2012-07-14 (×9): 3 mL via INTRAVENOUS

## 2012-06-28 MED ORDER — IOHEXOL 300 MG/ML  SOLN
20.0000 mL | INTRAMUSCULAR | Status: AC
  Administered 2012-06-28 (×2): 20 mL via ORAL

## 2012-06-28 MED ORDER — CALCITRIOL 0.25 MCG PO CAPS
0.2500 ug | ORAL_CAPSULE | ORAL | Status: DC
  Administered 2012-06-28 – 2012-07-14 (×8): 0.25 ug via ORAL
  Filled 2012-06-28 (×9): qty 1

## 2012-06-28 MED ORDER — SODIUM CHLORIDE 0.9 % IJ SOLN: 3.0000 mL | INTRAMUSCULAR | Status: DC | PRN

## 2012-06-28 MED ORDER — OXYCODONE HCL 5 MG PO TABS
10.0000 mg | ORAL_TABLET | ORAL | Status: DC | PRN
  Administered 2012-07-08: 10 mg via ORAL
  Filled 2012-06-28: qty 2
  Filled 2012-06-28: qty 1

## 2012-06-28 NOTE — Progress Notes (Signed)
Patient ID: Fabrizzio Marcella, male   DOB: May 20, 1956, 56 y.o.   MRN: [id number]   Subjective: I did not see the patient today to have reviewed his chart and recent x-ray findings. Renal function has been slowly improving with catheter drainage. Bone scan did not show any evidence of obvious metastatic disease.  Objective: Vital signs in last 24 hours: Temp:  [97.8 F (36.6 C)-99.6 F (37.6 C)] 98.5 F (36.9 C) (07/29 0946) Pulse Rate:  [86-113] 107  (07/29 0946) Resp:  [16-19] 18  (07/29 0946) BP: (131-152)/(85-99) 152/90 mmHg (07/29 0946) SpO2:  [93 %-95 %] 94 % (07/29 0946) Weight:  [76.1 kg (167 lb 12.3 oz)] 76.1 kg (167 lb 12.3 oz) (07/28 2120)  Intake/Output from previous day: 07/28 0701 - 07/29 0700 In: 480 [P.O.:480] Out: 975 [Urine:975] Intake/Output this shift:    Physical Exam:  Not done.  Lab Results:  Basename 06/27/12 0600  HGB 9.3*  HCT 26.1*   BMET  Basename 06/28/12 0600 06/27/12 0600  NA 135 137  K 5.5* 5.2*  CL 98 100  CO2 25 22  GLUCOSE 120* 120*  BUN 51* 50*  CREATININE 5.35* 5.53*  CALCIUM 9.6 9.6   No results found for this basename: LABPT:3,INR:3 in the last 72 hours No results found for this basename: LABURIN:1 in the last 72 hours No results found for this or any previous visit.  Studies/Results: Nm Bone Scan Whole Body  06/27/2012  *RADIOLOGY REPORT*  Clinical Data: Elevated PSA.  NUCLEAR MEDICINE WHOLE BODY BONE SCINTIGRAPHY  Technique:  Whole body anterior and posterior images were obtained approximately 3 hours after intravenous injection of radiopharmaceutical.  Radiopharmaceutical: CURIE TC-MDP TECHNETIUM TC 56M MEDRONATE IV KIT  Comparison: ultrasound 06/24/2012.  Findings: There are mild degenerative changes but no findings suspicious for osseous metastatic disease.  Mild bilateral hydronephrosis is suspected.  IMPRESSION: Negative bone scan for osseous metastatic disease.  Original Report Authenticated By: P. Loralie Champagne, M.D.      Assessment/Plan:   Mr. Dery has chronic renal failure slowly improving with placement of a Foley catheter. It is unlikely to normalize but will hopefully continue to improve. Patient is likely to have locally advanced prostate cancer based on PSA data. Again rectal exam is been refused by the patient on numerous occasions. I do think a pelvic CT scan might be helpful to look locally at the prostate. He will BE limited by the lack of IV contrast of may provide some additional information. Ultimately I think this is a patient who is can require a TURP of the prostate to both eliminate the obstruction and to obtain a tissue sampling. Just given my experience with them I doubt he has given consent or allow me to do an ultrasound biopsy in my office under no anesthesia. If TURP confirms out prostate cancer and we can not find definitive evidence of metastatic disease and he would likely be treated with radiation therapy along with 12-24 months of hormonal therapy if he would allow that. It would be best to see how his renal function stabilizes before performing TURP and therefore ultimately he may have to be discharged as an outpatient with an indwelling catheter with plans on a surgical procedure sometime in the next one to 3 weeks. That again is if he is willing to proceed with planned recommendations.   LOS: 5 days   Jin Capote S 06/28/2012, 11:11 AM

## 2012-06-28 NOTE — Progress Notes (Signed)
   CARE MANAGEMENT NOTE 06/28/2012  Patient:  Manuel Lucas, Manuel Lucas   Account Number:  [id number]  Date Initiated:  06/25/2012  Documentation initiated by:  Ronny Flurry  Subjective/Objective Assessment:   renal failure due to hypertension     Action/Plan:   Anticipated DC Date:  06/27/2012   Anticipated DC Plan:  HOME/SELF CARE      DC Planning Services  CM consult      Choice offered to / List presented to:  C-1 Patient        HH arranged  HH-1 RN      [facility] agency  [facility]   Status of service:  In process, will continue to follow Medicare Important Message given?   (If response is "NO", the following Medicare IM given date fields will be blank) Date Medicare IM given:   Date Additional Medicare IM given:    Discharge Disposition:    Per UR Regulation:  Reviewed for med. necessity/level of care/duration of stay  If discussed at Long Length of Stay Meetings, dates discussed:    Comments:  06/28/2012  1500 Darlyne Russian RN, [place]  [phone]  Met with patient to discuss discharge planning and home health services RN visits. Discussed medications, he can afford a $4 co pay. He lives at the [facility]. CM to continue to follow for discharge planning needs.  [facility]/Marie: referral for RN visits.

## 2012-06-28 NOTE — Progress Notes (Signed)
Subjective:   Objective Vital signs in last 24 hours: Filed Vitals:   06/27/12 1700 06/27/12 2120 06/28/12 0510 06/28/12 0946  BP: 136/94 147/99 131/85 152/90  Pulse: 106 108 113 107  Temp: 98.4 F (36.9 C) 99.6 F (37.6 C) 98.4 F (36.9 C) 98.5 F (36.9 C)  TempSrc: Oral Oral Oral Oral  Resp: 18 16 16 18   Height:      Weight:  76.1 kg (167 lb 12.3 oz)    SpO2: 95% 94% 93% 94%   Weight change: -3.9 kg (-8 lb 9.6 oz)  Intake/Output Summary (Last 24 hours) at 06/28/12 1403 Last data filed at 06/28/12 0500  Gross per 24 hour  Intake      0 ml  Output    975 ml  Net   -975 ml  PE: Labs: Basic Metabolic Panel:  Lab 06/28/12 1610 06/27/12 0600 06/26/12 1124 06/25/12 0600 06/24/12 0615 06/23/12 1511 06/23/12 1327  NA 135 137 139 139 -- 139 140  K 5.5* 5.2* 5.4* 4.9 -- 4.9 5.3*  CL 98 100 102 104 -- 101 105  CO2 25 22 24 23  -- 24 --  GLUCOSE 120* 120* 127* 97 -- 130* 107*  BUN 51* 50* 53* 57* -- 57* 58*  CREATININE 5.35* 5.53* 5.97* 6.87* -- 5.88* 5.80*  ALB -- -- -- -- -- -- --  CALCIUM 9.6 9.6 9.1 8.8 -- 9.7 --  PHOS 5.1* 5.9* -- -- 5.0* -- --   Liver Function Tests:  Lab 06/28/12 0600 06/27/12 0600 06/25/12 0600 06/23/12 2108  AST -- -- 18 16  ALT -- -- 12 13  ALKPHOS -- -- 38* 37*  BILITOT -- -- 0.3 0.3  PROT -- -- 7.2 7.6  ALBUMIN 3.6 3.6 3.3* --   Lab 06/27/12 0600 06/25/12 0600 06/24/12 0757 06/23/12 2108  WBC 9.8 5.7 6.7 7.6  NEUTROABS -- -- -- --  HGB 9.3* 7.2* 7.1* 7.3*  HCT 26.1* 20.8* 20.2* 21.2*  MCV 77.9* 78.2 78.6 78.2  PLT 196 152 136* 156   Studies/Results: Nm Bone Scan Whole Body  06/27/2012  *RADIOLOGY REPORT*  Clinical Data: Elevated PSA.  NUCLEAR MEDICINE WHOLE BODY BONE SCINTIGRAPHY  Technique:  Whole body anterior and posterior images were obtained approximately 3 hours after intravenous injection of radiopharmaceutical.  Radiopharmaceutical: CURIE TC-MDP TECHNETIUM TC 77M MEDRONATE IV KIT  Comparison: ultrasound 06/24/2012.   Findings: There are mild degenerative changes but no findings suspicious for osseous metastatic disease.  Mild bilateral hydronephrosis is suspected.  IMPRESSION: Negative bone scan for osseous metastatic disease.  Original Report Authenticated By: P. Loralie Champagne, M.D.   Medications: . sodium chloride 20 mL/hr at 06/24/12 0730   . amLODipine  10 mg Oral Daily  . iohexol  20 mL Oral Q1 Hr x 2  . pantoprazole  40 mg Oral BID AC  . sodium chloride  3 mL Intravenous Q12H  . sodium polystyrene  15 g Oral Once  . DISCONTD: amLODipine  5 mg Oral Daily  . DISCONTD: pantoprazole (PROTONIX) IV  40 mg Intravenous Q12H    I  have reviewed scheduled and prn medications.  Assessment/Recommendations: 1. Renal failure - unknown baseline creatinine.  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  With foley drainage there is some improvement in renal function, but how much recovery he will get is totally unknown. Kidneys are somewhat echodense, UA is bland. May have some underlying CKD independent of the obstructive process. I have not pursued further discussion with him about vascular  access for dialysis - since he is finally agreeing to some eval/Rx for his cancer don't want to overwhelm him into changing his mind and am pleasantly surprised that there appears to be a little improvement in his renal function. 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 Anemia - Hb up from 7.2 to 9.3 (if believable)  Tsat OK.  If remains consistently below 10, consider ESA 4. Prostate cancer - Dr. Ellin Goodie note reviewed.  Camille Bal, MD [facility] [phone] pager 06/28/2012, 2:03 PM

## 2012-06-28 NOTE — Progress Notes (Signed)
[facility] PROGRESS NOTE  Manuel Lucas WUJ:[id number] DOB: Oct 12, 1956 DOA: 06/23/2012 PCP: Sheila Oats, MD  Assessment/Plan: Principal Problem:  *Obstructive uropathy Active Problems:  Renal failure  Anemia  Elevated blood pressure  1. Renal Failure:  - Renal ultrasound interpreted as mild/moderate BL hydronephrosis. Urinary bladder is distended and there are bilateral ureteral jets. Concern for urinary bladder retention and /or bladder outlet obstruction.  - Nephrology and Urology on board.  - Urology placed foley catheter and reportedly patient had > 1.5 Liter diuresis overnight afterward. - Urologist recommendations have been reviewed.  Will plan on discharging patient likely tomorrow with foley bag and follow up with urology for Turp.  Patient is aware and verbalizes agreement and understanding. - Made note of nephrologist recommendations and very much appreciate their input. - Pain control.  Not well controlled on Oxy IR 5 mg.  Will increased dose today.  2. Anemia  - Stable with no visible active bleeding today  - will recheck tomorrow - Appreciate Nephrology's input and I have reviewed their recommendations.  3. Blood pressure  - Not well controlled amlodipine added this admission. Relatively well controlled and will plan on continuing current regimen on discharge.  Code Status: Full  Family Communication: None at bedside  Disposition Plan: Pending clinical improvement   Brief narrative:  Patient is a 56 y/o with no prior follow up with a PCP that presented to the ED complaining of increased edema BL in his legs. Upon further work-up patient was found to have an elevated creatinine and he was admitted for further evaluation and recommendations. Nephrology was called for further evaluation and recommendations. After Renal U/S was obtained which showed BL mild/moderate hydronephrosis and other findings it was recommended to place foley. Nursing tried two times to  place foley but were unsuccessful. Subsequently Urology was called for further recommendations.   Consultants:  Nephrology  Urology Procedures:  None currently Antibiotics:  None    HPI/Subjective: Patient has no new complaints at this juncture.    Objective: Filed Vitals:   06/27/12 2120 06/28/12 0510 06/28/12 0946 06/28/12 1400  BP: 147/99 131/85 152/90 133/91  Pulse: 108 113 107 114  Temp: 99.6 F (37.6 C) 98.4 F (36.9 C) 98.5 F (36.9 C) 97.9 F (36.6 C)  TempSrc: Oral Oral Oral Oral  Resp: 16 16 18 18   Height:      Weight: 76.1 kg (167 lb 12.3 oz)     SpO2: 94% 93% 94% 95%    Intake/Output Summary (Last 24 hours) at 06/28/12 1556 Last data filed at 06/28/12 1300  Gross per 24 hour  Intake      0 ml  Output   1350 ml  Net  -1350 ml    Exam:  General: Pt in NAD, A and O x 3  Cardiovascular: RRR, No MRG  Respiratory: CTA BL, no wheezes  Abdomen: Soft, NT, ND  GU: Foley in place. No active bleeding noted at catheter insertion site.  Data Reviewed: Basic Metabolic Panel:  Lab 06/28/12 9562 06/27/12 0600 06/26/12 1124 06/25/12 0600 06/24/12 0615 06/23/12 1511  NA 135 137 139 139 -- 139  K 5.5* 5.2* 5.4* 4.9 -- 4.9  CL 98 100 102 104 -- 101  CO2 25 22 24 23  -- 24  GLUCOSE 120* 120* 127* 97 -- 130*  BUN 51* 50* 53* 57* -- 57*  CREATININE 5.35* 5.53* 5.97* 6.87* -- 5.88*  CALCIUM 9.6 9.6 9.1 8.8 -- 9.7  MG -- -- -- -- -- --  PHOS 5.1* 5.9* -- -- 5.0* --   Liver Function Tests:  Lab 06/28/12 0600 06/27/12 0600 06/25/12 0600 06/23/12 2108  AST -- -- 18 16  ALT -- -- 12 13  ALKPHOS -- -- 38* 37*  BILITOT -- -- 0.3 0.3  PROT -- -- 7.2 7.6  ALBUMIN 3.6 3.6 3.3* 3.5   No results found for this basename: LIPASE:5,AMYLASE:5 in the last 168 hours No results found for this basename: AMMONIA:5 in the last 168 hours CBC:  Lab 06/27/12 0600 06/25/12 0600 06/24/12 0757 06/23/12 2108 06/23/12 1511  WBC 9.8 5.7 6.7 7.6 8.9  NEUTROABS -- -- -- -- --  HGB  9.3* 7.2* 7.1* 7.3* 8.0*  HCT 26.1* 20.8* 20.2* 21.2* 23.0*  MCV 77.9* 78.2 78.6 78.2 78.5  PLT 196 152 136* 156 171   Cardiac Enzymes:  Lab 06/24/12 0800 06/23/12 1859  CKTOTAL 179 246*  CKMB 3.1 --  CKMBINDEX -- --  TROPONINI <0.30 --   BNP (last 3 results) No results found for this basename: PROBNP:3 in the last 8760 hours CBG: No results found for this basename: GLUCAP:5 in the last 168 hours  No results found for this or any previous visit (from the past 240 hour(s)).   Studies: Dg Chest 2 View  06/23/2012  *RADIOLOGY REPORT*  Clinical Data: Acute renal failure  CHEST - 2 VIEW  Comparison: None.  Findings: Normal heart size.  Lungs are clear.  No pneumothorax and no pleural effusion.  IMPRESSION: No active cardiopulmonary disease.  Original Report Authenticated By: Donavan Burnet, M.D.   Nm Bone Scan Whole Body  06/27/2012  *RADIOLOGY REPORT*  Clinical Data: Elevated PSA.  NUCLEAR MEDICINE WHOLE BODY BONE SCINTIGRAPHY  Technique:  Whole body anterior and posterior images were obtained approximately 3 hours after intravenous injection of radiopharmaceutical.  Radiopharmaceutical: CURIE TC-MDP TECHNETIUM TC 70M MEDRONATE IV KIT  Comparison: ultrasound 06/24/2012.  Findings: There are mild degenerative changes but no findings suspicious for osseous metastatic disease.  Mild bilateral hydronephrosis is suspected.  IMPRESSION: Negative bone scan for osseous metastatic disease.  Original Report Authenticated By: P. Loralie Champagne, M.D.   US Renal  06/24/2012  *RADIOLOGY REPORT*  Clinical Data: Renal failure.  RENAL/URINARY TRACT ULTRASOUND COMPLETE  Comparison:  None.  Findings:  Right Kidney:  The right kidney measures 10.6 cm in length with slightly increased echotexture.  There is mild/moderate right hydronephrosis.  Left Kidney:  The left kidney measures 11.3 cm in length.  There is mild/moderate left hydronephrosis.  Bladder:  The urinary bladder is filled with urine.  There  are bilateral ureteral jets.  IMPRESSION: Mild/moderate bilateral hydronephrosis.  The urinary bladder is distended and there are bilateral ureteral jets.  Findings raise concern for urinary bladder retention and/or bladder outlet obstruction.  Original Report Authenticated By: Richarda Overlie, M.D.    Scheduled Meds:   . amLODipine  10 mg Oral Daily  . calcitRIOL  0.25 mcg Oral QODAY  . iohexol  20 mL Oral Q1 Hr x 2  . pantoprazole  40 mg Oral BID AC  . sodium chloride  3 mL Intravenous Q12H  . sodium chloride  3 mL Intravenous Q12H  . sodium polystyrene  15 g Oral Once   Continuous Infusions:   . sodium chloride 20 mL/hr at 06/24/12 0730    Principal Problem:  *Obstructive uropathy Active Problems:  Renal failure  Anemia  Elevated blood pressure    Time spent: > 35 minutes     Tighe Gitto,  [facility]  [facility] Pager [phone]. If 8PM-8AM, please contact night-coverage at [email or web address], password [facility] 06/28/2012, 3:56 PM  LOS: 5 days

## 2012-06-29 DIAGNOSIS — R590 Localized enlarged lymph nodes: Secondary | ICD-10-CM | POA: Diagnosis present

## 2012-06-29 DIAGNOSIS — R599 Enlarged lymph nodes, unspecified: Secondary | ICD-10-CM

## 2012-06-29 DIAGNOSIS — D72829 Elevated white blood cell count, unspecified: Secondary | ICD-10-CM

## 2012-06-29 LAB — CBC
HCT: 25.2 % — ABNORMAL LOW (ref 39.0–52.0)
Hemoglobin: 9 g/dL — ABNORMAL LOW (ref 13.0–17.0)
MCHC: 35.7 g/dL (ref 30.0–36.0)
MCV: 77.8 fL — ABNORMAL LOW (ref 78.0–100.0)
RDW: 15.2 % (ref 11.5–15.5)

## 2012-06-29 LAB — URINALYSIS, ROUTINE W REFLEX MICROSCOPIC
Glucose, UA: NEGATIVE mg/dL
Ketones, ur: NEGATIVE mg/dL
Nitrite: NEGATIVE
Protein, ur: >300 mg/dL — AB
Urobilinogen, UA: 1 mg/dL (ref 0.0–1.0)

## 2012-06-29 LAB — RENAL FUNCTION PANEL
BUN: 53 mg/dL — ABNORMAL HIGH (ref 6–23)
CO2: 23 mEq/L (ref 19–32)
Calcium: 9.6 mg/dL (ref 8.4–10.5)
Glucose, Bld: 121 mg/dL — ABNORMAL HIGH (ref 70–99)
Phosphorus: 6.7 mg/dL — ABNORMAL HIGH (ref 2.3–4.6)

## 2012-06-29 LAB — URINE MICROSCOPIC-ADD ON

## 2012-06-29 MED ORDER — SIMETHICONE 80 MG PO CHEW
80.0000 mg | CHEWABLE_TABLET | Freq: Four times a day (QID) | ORAL | Status: DC | PRN
  Filled 2012-06-29 (×2): qty 1

## 2012-06-29 MED ORDER — CIPROFLOXACIN IN D5W 400 MG/200ML IV SOLN
400.0000 mg | INTRAVENOUS | Status: DC
  Administered 2012-06-29: 400 mg via INTRAVENOUS
  Filled 2012-06-29 (×2): qty 200

## 2012-06-29 MED ORDER — SODIUM CHLORIDE 0.9 % IV SOLN: 250.0000 mL | INTRAVENOUS | Status: DC | PRN

## 2012-06-29 NOTE — Progress Notes (Signed)
Subjective: refused BP med earlier said "did not need it" Feels bad.  Cant eat (says food not good) and not drinking  Urine still bloody but lighter.  Very cloudy Worried about where he would go at D/C - presently homeless (was living at [employer] until lost his job)  Objective: Vital signs in last 24 hours: Filed Vitals:   06/28/12 1800 06/28/12 2119 06/29/12 0516 06/29/12 0946  BP: 142/91 125/83 128/84 128/82  Pulse: 113 105 116 110  Temp: 98.7 F (37.1 C) 99.8 F (37.7 C) 98.5 F (36.9 C) 98.8 F (37.1 C)  TempSrc: Oral Oral Oral Oral  Resp: 18 18 18 16   Height:  5\' 9"  (1.753 m)    Weight:  76.1 kg (167 lb 12.3 oz)    SpO2: 93% 96% 95% 95%   06/28/12 2119 76.1 kg (167 lb 12.3 oz) 1.92 sq meters 24.8 MF 06/27/12 2120 76.1 kg (167 lb 12.3 oz) -- -- MW  06/26/12 2153 80 kg (176 lb 5.9 oz) -- -- MW  06/25/12 2056 82.6 kg (182 lb 1.6 oz) 2.01 sq meters 26.9 MF 06/24/12 2047 82.6 kg (182 lb 1.6 oz) 2.01 sq meters 26.9 MF 06/23/12 2154 82.6 kg (182   Weight change: 0 kg (0 lb)  Intake/Output Summary (Last 24 hours) at 06/29/12 1308 Last data filed at 06/29/12 0947  Gross per 24 hour  Intake    120 ml  Output   1400 ml  Net  -1280 ml   Physical Exam:  Blood pressure 128/82, pulse 110, temperature 98.8 F (37.1 C), temperature source Oral, resp. rate 16, height 5\' 9"  (1.753 m), weight 76.1 kg (167 lb 12.3 oz), SpO2 95.00%. Thin BM Looks uncomfortable Lungs clear Exts no edema - looks dry - poor tissue turgot  Labs: Basic Metabolic Panel:  Lab 06/29/12 1478 06/28/12 0600 06/27/12 0600 06/26/12 1124 06/25/12 0600 06/24/12 0615 06/23/12 1511 06/23/12 1327  NA 135 135 137 139 139 -- 139 140  K 4.5 5.5* 5.2* 5.4* 4.9 -- 4.9 5.3*  CL 96 98 100 102 104 -- 101 105  CO2 23 25 22 24 23  -- 24 --  GLUCOSE 121* 120* 120* 127* 97 -- 130* 107*  BUN 53* 51* 50* 53* 57* -- 57* 58*  CREATININE 5.20* 5.35* 5.53* 5.97* 6.87* -- 5.88* 5.80*  ALB -- -- -- -- -- -- -- --  CALCIUM 9.6  9.6 9.6 9.1 8.8 -- 9.7 --  PHOS 6.7* 5.1* 5.9* -- -- 5.0* -- --   Liver Function Tests:  Lab 06/29/12 0535 06/28/12 0600 06/27/12 0600 06/25/12 0600 06/23/12 2108  AST -- -- -- 18 16  ALT -- -- -- 12 13  ALKPHOS -- -- -- 38* 37*  BILITOT -- -- -- 0.3 0.3  PROT -- -- -- 7.2 7.6  ALBUMIN 3.1* 3.6 3.6 -- --   Lab 06/29/12 0535 06/27/12 0600 06/25/12 0600 06/24/12 0757  WBC 18.4* 9.8 5.7 6.7  NEUTROABS -- -- -- --  HGB 9.0* 9.3* 7.2* 7.1*  HCT 25.2* 26.1* 20.8* 20.2*  MCV 77.8* 77.9* 78.2 78.6  PLT 201 196 152 136*   Studies/Results: Ct Pelvis Wo Contrast  06/28/2012  *RADIOLOGY REPORT*  Clinical Data:  Elevated PSA.  Suspected prostate cancer.  Anemia. The patient is refusing rectal in the examination.  CT PELVIS WITHOUT CONTRAST  Technique:  Multidetector CT imaging of the pelvis was performed following the standard protocol without intravenous contrast.  Comparison:  No priors.  Findings:  Pelvis:  A Foley balloon catheter is present within the lumen of the urinary bladder.  The urinary bladder is nearly completely decompressed.  Apparent wall thickening of the urinary bladder could simply be secondary to decompression, however, there is stranding in the perivesical fat, which could suggest inflammation or infiltration of the urinary bladder wall. There is also adjacent perivesical lymphadenopathy, with the largest single lymph node in the anterior aspect of the pelvis on the right measuring 1.2 cm in short axis (image 39 of series 2).  The prostate gland and does not appear to be enlarged on this noncontrast CT examination.  However, there is profound lymphadenopathy along the pelvic sidewalls bilaterally, with a left external iliac lymph node measuring 5.4 x 4.3 cm, and a right external iliac lymph node measuring 4.3 x 7.3 cm.  These enlarged lymph nodes exert mass effect upon the adjacent arterial and venous structures, and there is stranding in the fat adjacent to the right external iliac  artery and vein.  There appears to be multiple additional lymph nodes enlarged in the presacral space and common iliac regions bilaterally, including a right common iliac lymph node measuring 1.4 cm in short axis.  Musculoskeletal: There are no aggressive appearing lytic or blastic lesions noted in the visualized portions of the skeleton.  IMPRESSION: 1.  The prostate gland does not appear to be enlarged and is relatively unremarkable in appearance on this noncontrast CT examination. This does not exclude the presence of prostate cancer. 2.  However, there is a profound lymphadenopathy in the pelvis, most notably in the external iliac chains bilaterally, measuring up to 4.4 x 7.3 cm on the right and 5.4 x 4.3 cm on the left. Clinical correlation for signs and symptoms of lymphoproliferative disorder is recommended. 3.  These lymph nodes exert mass effect upon adjacent vascular structures, particularly on the right, and there is extensive stranding around the right external iliac artery and vein.  This could suggest the presence of deep venous thrombosis.  Clinical correlation for signs and symptoms of right lower extremity edema is recommended.  If there is clinical concern for right lower extremity deep venous thrombosis, further evaluation with Doppler ultrasound may be warranted. 4.  A Foley balloon catheter is present within the lumen of the urinary bladder.  The urinary bladder appears nearly completely decompressed, but demonstrate some potential wall thickening and there are inflammatory changes surrounding the urinary bladder in the perivesical fat.  This could be secondary to inflammation or infiltration from neoplasm.  Clinical correlation is recommended.  These results were called by telephone on 06/28/2012 at 04:50 p.m. to Dr. Isabel Caprice, who verbally acknowledged these results. Subsequently, findings were also discussed with Dr. Cena Benton by Dr. Llana Aliment on 06/28/2012 of 05:05 p.m.  Original Report Authenticated  By: Florencia Reasons, M.D.   Nm Bone Scan Whole Body  06/27/2012  *RADIOLOGY REPORT*  Clinical Data: Elevated PSA.  NUCLEAR MEDICINE WHOLE BODY BONE SCINTIGRAPHY  Technique:  Whole body anterior and posterior images were obtained approximately 3 hours after intravenous injection of radiopharmaceutical.  Radiopharmaceutical: CURIE TC-MDP TECHNETIUM TC 63M MEDRONATE IV KIT  Comparison: ultrasound 06/24/2012.  Findings: There are mild degenerative changes but no findings suspicious for osseous metastatic disease.  Mild bilateral hydronephrosis is suspected.  IMPRESSION: Negative bone scan for osseous metastatic disease.  Original Report Authenticated By: P. Loralie Champagne, M.D.    Medications . amLODipine  10 mg Oral Daily  . calcitRIOL  0.25 mcg Oral QODAY  . iohexol  20 mL Oral Q1 Hr x 2  . pantoprazole  40 mg Oral BID AC  . sodium chloride  3 mL Intravenous Q12H  . sodium chloride  3 mL Intravenous Q12H    I  have reviewed scheduled and prn medications.  ASSESSMENT/RECS 1. Renal failure - unknown baseline creatinine. 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 With foley drainage there is some improvement in renal function, but how much recovery he will get is totally unknown. Kidneys are somewhat echodense, UA is bland. May have some underlying CKD independent of the obstructive process. I have not pursued further discussion with him about vascular access for dialysis - since he is finally agreeing to some eval/Rx for his cancer don't want to overwhelm him into changing his mind and am pleasantly surprised that there appears to be a continued slow improvement in his renal function. Looks dry to me.  Give a liter of NS. 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 Anemia - Hb 9.2 -  9.3Tsat OK. If remains consistently below 10, consider ESA  4. Prostate cancer - Dr. Ellin Goodie note reviewed. CT reviewed.  Await further input from urol 5. Leukocytosis - culture  urine. 6. Dispo - ? Where he will go - says he is homeless.  Camille Bal, MD [facility] [phone] pager 06/29/2012, 1:08 PM

## 2012-06-29 NOTE — Progress Notes (Signed)
ANTIBIOTIC CONSULT NOTE - INITIAL  Pharmacy Consult for Cipro Indication: UTI  No Known Allergies  Patient Measurements: Height: 5\' 9"  (175.3 cm) Weight: 167 lb 12.3 oz (76.1 kg) IBW/kg (Calculated) : 70.7  Adjusted Body Weight:   Vital Signs: Temp: 98.1 F (36.7 C) (07/30 1702) Temp src: Oral (07/30 1702) BP: 126/85 mmHg (07/30 1702) Pulse Rate: 105  (07/30 1702) Intake/Output from previous day: 07/29 0701 - 07/30 0700 In: 0  Out: 1575 [Urine:1575] Intake/Output from this shift:    Labs:  Basename 06/29/12 0535 06/28/12 0600 06/27/12 0600  WBC 18.4* -- 9.8  HGB 9.0* -- 9.3*  PLT 201 -- 196  LABCREA -- -- --  CREATININE 5.20* 5.35* 5.53*   Estimated Creatinine Clearance: 15.5 ml/min (by C-G formula based on Cr of 5.2). No results found for this basename: VANCOTROUGH:2,VANCOPEAK:2,VANCORANDOM:2,GENTTROUGH:2,GENTPEAK:2,GENTRANDOM:2,TOBRATROUGH:2,TOBRAPEAK:2,TOBRARND:2,AMIKACINPEAK:2,AMIKACINTROU:2,AMIKACIN:2, in the last 72 hours   Microbiology: No results found for this or any previous visit (from the past 720 hour(s)).  Medical History: Past Medical History  Diagnosis Date  . No pertinent past medical history     Medications:  Scheduled:    . amLODipine  10 mg Oral Daily  . calcitRIOL  0.25 mcg Oral QODAY  . ciprofloxacin  400 mg Intravenous Q24H  . pantoprazole  40 mg Oral BID AC  . sodium chloride  3 mL Intravenous Q12H  . sodium chloride  3 mL Intravenous Q12H   Assessment: 56 yr old male admitted with renal failure, possible prostate Ca, possible UTI. Pt is homeless. CrCl is 15.5 ml/min.   Plan:  Will order Cipro 400 mg IV q24h.   Eugene Garnet 06/29/2012,7:58 PM

## 2012-06-29 NOTE — Progress Notes (Signed)
[facility] PROGRESS NOTE  Manuel Lucas YNW:[id number] DOB: 10/25/1956 DOA: 06/23/2012 PCP: Sheila Oats, MD  Assessment/Plan: Principal Problem:  *Obstructive uropathy Active Problems:  Renal failure  Anemia  Elevated blood pressure Localized Enlarged lymph nodes Leukocytosis  1. Renal Failure:  - Renal ultrasound interpreted as mild/moderate BL hydronephrosis. Urologist was consulted and foley catheter was placed.  - Nephrology and Urology on board.  - Urology placed foley catheter and reportedly patient had > 1.5 Liter diuresis overnight afterward.  - Urologist recommendations have been reviewed.  Patient had CT of pelvis recently which showed enlarged lymph nodes and there is concern for either metastatic prostate cancer vs lymphoma. - Made note of nephrologist recommendations and very much appreciate their input.  - Pain control. Increased Oxy IR to 10mg  po q 4 hours  2. Anemia  - Stable with no visible active bleeding today  - will recheck tomorrow  - Appreciate Nephrology's input and I have reviewed their recommendations.   3. Blood pressure  - Not well controlled amlodipine added this admission. Relatively well controlled currently and will plan on continuing current regimen on discharge.   4. Leukocytosis - At this point etiology uncertain.  Patient is non toxic appearing.   - Denies any cough or SOB - U/A ordered and shows leukocytosis with cloudy urine and few bacteria.  Will place order for urine culture - Place on cipro  5. Locally enlarged lymph nodes - At this point have discussed at length with patient about need to evaluate and obtain tissue sample.  Patient's response was "I'll think about it." - Will go ahead and place consult for IR to evaluate patient - Place order to make patient NPO past midnight.  6. Anemia  - Stable some blood in foley bag but diminishing  Code Status: Full  Family Communication: None at bedside  Disposition Plan:  Pending clinical improvement   Brief narrative:  Patient is a 56 y/o with no prior follow up with a PCP that presented to the ED complaining of increased edema BL in his legs. Upon further work-up patient was found to have an elevated creatinine and he was admitted for further evaluation. Nephrology was called for further evaluation and recommendations and work up showed with renal U/S showed BL mild/moderate hydronephrosis. Subsequently urologist who placed foley and patient had large diuresis.  PSA was ordered which came back 106.  Bone scan was negative but CT scan of pelvis showed lymphadenopathy and subsequent recommendations were placed for IR to obtain tissue biopsy.    Consultants:  Nephrology  Urology IR Procedures:  None currently Antibiotics:  Cipro   HPI/Subjective: Patient had no new complaints.  No acute issues reported overnight.  Objective: Filed Vitals:   06/29/12 0946 06/29/12 1420 06/29/12 1449 06/29/12 1702  BP: 128/82 122/81 123/81 126/85  Pulse: 110 114 104 105  Temp: 98.8 F (37.1 C) 100 F (37.8 C) 99.4 F (37.4 C) 98.1 F (36.7 C)  TempSrc: Oral Oral Oral Oral  Resp: 16 19 18 18   Height:      Weight:      SpO2: 95% 96%  91%    Intake/Output Summary (Last 24 hours) at 06/29/12 1816 Last data filed at 06/29/12 1810  Gross per 24 hour  Intake    512 ml  Output   1650 ml  Net  -1138 ml    Exam:  General: Pt in NAD, A and O x 3  Cardiovascular: RRR, No MRG  Respiratory: CTA BL, no wheezes  Abdomen: Soft, NT, ND  GU: Foley in place. No active bleeding noted at catheter insertion site. Some blood in foley bag  Data Reviewed: Basic Metabolic Panel:  Lab 06/29/12 1610 06/28/12 0600 06/27/12 0600 06/26/12 1124 06/25/12 0600 06/24/12 0615  NA 135 135 137 139 139 --  K 4.5 5.5* 5.2* 5.4* 4.9 --  CL 96 98 100 102 104 --  CO2 23 25 22 24 23  --  GLUCOSE 121* 120* 120* 127* 97 --  BUN 53* 51* 50* 53* 57* --  CREATININE 5.20* 5.35* 5.53* 5.97*  6.87* --  CALCIUM 9.6 9.6 9.6 9.1 8.8 --  MG -- -- -- -- -- --  PHOS 6.7* 5.1* 5.9* -- -- 5.0*   Liver Function Tests:  Lab 06/29/12 0535 06/28/12 0600 06/27/12 0600 06/25/12 0600 06/23/12 2108  AST -- -- -- 18 16  ALT -- -- -- 12 13  ALKPHOS -- -- -- 38* 37*  BILITOT -- -- -- 0.3 0.3  PROT -- -- -- 7.2 7.6  ALBUMIN 3.1* 3.6 3.6 3.3* 3.5   No results found for this basename: LIPASE:5,AMYLASE:5 in the last 168 hours No results found for this basename: AMMONIA:5 in the last 168 hours CBC:  Lab 06/29/12 0535 06/27/12 0600 06/25/12 0600 06/24/12 0757 06/23/12 2108  WBC 18.4* 9.8 5.7 6.7 7.6  NEUTROABS -- -- -- -- --  HGB 9.0* 9.3* 7.2* 7.1* 7.3*  HCT 25.2* 26.1* 20.8* 20.2* 21.2*  MCV 77.8* 77.9* 78.2 78.6 78.2  PLT 201 196 152 136* 156   Cardiac Enzymes:  Lab 06/24/12 0800 06/23/12 1859  CKTOTAL 179 246*  CKMB 3.1 --  CKMBINDEX -- --  TROPONINI <0.30 --   BNP (last 3 results) No results found for this basename: PROBNP:3 in the last 8760 hours CBG: No results found for this basename: GLUCAP:5 in the last 168 hours  No results found for this or any previous visit (from the past 240 hour(s)).   Studies: Dg Chest 2 View  06/23/2012  *RADIOLOGY REPORT*  Clinical Data: Acute renal failure  CHEST - 2 VIEW  Comparison: None.  Findings: Normal heart size.  Lungs are clear.  No pneumothorax and no pleural effusion.  IMPRESSION: No active cardiopulmonary disease.  Original Report Authenticated By: Donavan Burnet, M.D.   Ct Pelvis Wo Contrast  06/28/2012  *RADIOLOGY REPORT*  Clinical Data:  Elevated PSA.  Suspected prostate cancer.  Anemia. The patient is refusing rectal in the examination.  CT PELVIS WITHOUT CONTRAST  Technique:  Multidetector CT imaging of the pelvis was performed following the standard protocol without intravenous contrast.  Comparison:  No priors.  Findings:  Pelvis:  A Foley balloon catheter is present within the lumen of the urinary bladder.  The urinary bladder  is nearly completely decompressed.  Apparent wall thickening of the urinary bladder could simply be secondary to decompression, however, there is stranding in the perivesical fat, which could suggest inflammation or infiltration of the urinary bladder wall. There is also adjacent perivesical lymphadenopathy, with the largest single lymph node in the anterior aspect of the pelvis on the right measuring 1.2 cm in short axis (image 39 of series 2).  The prostate gland and does not appear to be enlarged on this noncontrast CT examination.  However, there is profound lymphadenopathy along the pelvic sidewalls bilaterally, with a left external iliac lymph node measuring 5.4 x 4.3 cm, and a right external iliac lymph node measuring 4.3 x 7.3 cm.  These enlarged lymph nodes  exert mass effect upon the adjacent arterial and venous structures, and there is stranding in the fat adjacent to the right external iliac artery and vein.  There appears to be multiple additional lymph nodes enlarged in the presacral space and common iliac regions bilaterally, including a right common iliac lymph node measuring 1.4 cm in short axis.  Musculoskeletal: There are no aggressive appearing lytic or blastic lesions noted in the visualized portions of the skeleton.  IMPRESSION: 1.  The prostate gland does not appear to be enlarged and is relatively unremarkable in appearance on this noncontrast CT examination. This does not exclude the presence of prostate cancer. 2.  However, there is a profound lymphadenopathy in the pelvis, most notably in the external iliac chains bilaterally, measuring up to 4.4 x 7.3 cm on the right and 5.4 x 4.3 cm on the left. Clinical correlation for signs and symptoms of lymphoproliferative disorder is recommended. 3.  These lymph nodes exert mass effect upon adjacent vascular structures, particularly on the right, and there is extensive stranding around the right external iliac artery and vein.  This could suggest  the presence of deep venous thrombosis.  Clinical correlation for signs and symptoms of right lower extremity edema is recommended.  If there is clinical concern for right lower extremity deep venous thrombosis, further evaluation with Doppler ultrasound may be warranted. 4.  A Foley balloon catheter is present within the lumen of the urinary bladder.  The urinary bladder appears nearly completely decompressed, but demonstrate some potential wall thickening and there are inflammatory changes surrounding the urinary bladder in the perivesical fat.  This could be secondary to inflammation or infiltration from neoplasm.  Clinical correlation is recommended.  These results were called by telephone on 06/28/2012 at 04:50 p.m. to Dr. Isabel Caprice, who verbally acknowledged these results. Subsequently, findings were also discussed with Dr. Cena Benton by Dr. Llana Aliment on 06/28/2012 of 05:05 p.m.  Original Report Authenticated By: Florencia Reasons, M.D.   Nm Bone Scan Whole Body  06/27/2012  *RADIOLOGY REPORT*  Clinical Data: Elevated PSA.  NUCLEAR MEDICINE WHOLE BODY BONE SCINTIGRAPHY  Technique:  Whole body anterior and posterior images were obtained approximately 3 hours after intravenous injection of radiopharmaceutical.  Radiopharmaceutical: CURIE TC-MDP TECHNETIUM TC 77M MEDRONATE IV KIT  Comparison: ultrasound 06/24/2012.  Findings: There are mild degenerative changes but no findings suspicious for osseous metastatic disease.  Mild bilateral hydronephrosis is suspected.  IMPRESSION: Negative bone scan for osseous metastatic disease.  Original Report Authenticated By: P. Loralie Champagne, M.D.   US Renal  06/24/2012  *RADIOLOGY REPORT*  Clinical Data: Renal failure.  RENAL/URINARY TRACT ULTRASOUND COMPLETE  Comparison:  None.  Findings:  Right Kidney:  The right kidney measures 10.6 cm in length with slightly increased echotexture.  There is mild/moderate right hydronephrosis.  Left Kidney:  The left kidney measures  11.3 cm in length.  There is mild/moderate left hydronephrosis.  Bladder:  The urinary bladder is filled with urine.  There are bilateral ureteral jets.  IMPRESSION: Mild/moderate bilateral hydronephrosis.  The urinary bladder is distended and there are bilateral ureteral jets.  Findings raise concern for urinary bladder retention and/or bladder outlet obstruction.  Original Report Authenticated By: Richarda Overlie, M.D.    Scheduled Meds:   . amLODipine  10 mg Oral Daily  . calcitRIOL  0.25 mcg Oral QODAY  . pantoprazole  40 mg Oral BID AC  . sodium chloride  3 mL Intravenous Q12H  . sodium chloride  3 mL Intravenous  Q12H   Continuous Infusions:   Principal Problem:  *Obstructive uropathy Active Problems:  Renal failure  Anemia  Elevated blood pressure    Time spent: > 35 minutes    Penny Pia  [facility] Pager [phone]. If 8PM-8AM, please contact night-coverage at [email or web address], password [facility] 06/29/2012, 6:16 PM  LOS: 6 days

## 2012-06-29 NOTE — Progress Notes (Signed)
Patient ID: Manuel Lucas, male   DOB: 28-Sep-1956, 56 y.o.   MRN: [id number] Patient's pelvic CT scan report and images reviewed.There is substantial pelvic adenopathy. Presumptively this is secondary to metastatic prostate cancer given his elevated PSA by its also conceivable that the patient could have a secondary malignancy that is lymphoproliferative in nature. [name] situation again remains complicated by his relatives lack of cooperation as well as his homelessness. My initial plan was to allow his renal function to improve with an indwelling Foley catheter and then proceed with attempts at establishing a definitive diagnosis with either transrectal or TUR biopsy of the prostate and potentially procedure to try to eliminate the obstruction. Obviously his lack of insurance/financial resources and homelessness external and management of his situation quite difficult. It certainly would be helpful for social work to get involved in discharge planning and also in trying to get this patient accepted into the Medicaid system. If the stent was in the hospital over the next few days I think it would be prudent for interventional radiology to be consulted for possible biopsy of the lymph node. If we can document metastatic adenocarcinoma in the lymph node then we can start him on some immediate hormonal therapy withFirmagon if he would allow that.This catheter needs to remain indwelling and if he takes it out or it is removed his renal function will undoubtedly worsened substantially. It is very slowly improving but it's unclear how much this will completely resolve with catheter drainage. The patient should also have a repeat renal ultrasound to see if the hydronephrosis has improved. He certainly may have some ongoing ureteral obstruction either secondary to the adenopathy or potentially bladder wall thickening. I will try to get over-the-counter hospital discussing this with [name] tomorrow.

## 2012-06-29 NOTE — Progress Notes (Signed)
Pt refusing Norvasc. States that he doesn't need it. Educated pt on importance of taking medication on BP and that his BP was 128/84 and that we like to keep it 120/80 or below. Verbalized understanding but still refusing.

## 2012-06-30 ENCOUNTER — Inpatient Hospital Stay (HOSPITAL_COMMUNITY): Payer: Self-pay

## 2012-06-30 DIAGNOSIS — N139 Obstructive and reflux uropathy, unspecified: Secondary | ICD-10-CM

## 2012-06-30 LAB — RENAL FUNCTION PANEL
BUN: 64 mg/dL — ABNORMAL HIGH (ref 6–23)
CO2: 23 mEq/L (ref 19–32)
Chloride: 94 mEq/L — ABNORMAL LOW (ref 96–112)
GFR calc Af Amer: 12 mL/min — ABNORMAL LOW (ref >90)
Glucose, Bld: 112 mg/dL — ABNORMAL HIGH (ref 70–99)
Phosphorus: 6.8 mg/dL — ABNORMAL HIGH (ref 2.3–4.6)
Potassium: 4.4 mEq/L (ref 3.5–5.1)
Sodium: 135 mEq/L (ref 135–145)

## 2012-06-30 LAB — PROTIME-INR
INR: 1.26 (ref 0.00–1.49)
Prothrombin Time: 16.1 seconds — ABNORMAL HIGH (ref 11.6–15.2)

## 2012-06-30 LAB — CBC
Hemoglobin: 8.6 g/dL — ABNORMAL LOW (ref 13.0–17.0)
MCH: 27.9 pg (ref 26.0–34.0)
Platelets: 195 10*3/uL (ref 150–400)
RBC: 3.08 MIL/uL — ABNORMAL LOW (ref 4.22–5.81)
WBC: 17.6 10*3/uL — ABNORMAL HIGH (ref 4.0–10.5)

## 2012-06-30 LAB — URINE CULTURE: Culture: NO GROWTH

## 2012-06-30 MED ORDER — MIDAZOLAM HCL 2 MG/2ML IJ SOLN
INTRAMUSCULAR | Status: AC
  Filled 2012-06-30: qty 4

## 2012-06-30 MED ORDER — DEXTROSE 5 % IV SOLN
1.0000 g | INTRAVENOUS | Status: DC
  Administered 2012-06-30: 1 g via INTRAVENOUS
  Filled 2012-06-30 (×2): qty 10

## 2012-06-30 MED ORDER — DARBEPOETIN ALFA-POLYSORBATE 60 MCG/0.3ML IJ SOLN
60.0000 ug | INTRAMUSCULAR | Status: DC
  Administered 2012-06-30: 60 ug via SUBCUTANEOUS
  Filled 2012-06-30 (×5): qty 0.3

## 2012-06-30 MED ORDER — FENTANYL CITRATE 0.05 MG/ML IJ SOLN
INTRAMUSCULAR | Status: AC
  Filled 2012-06-30: qty 4

## 2012-06-30 MED ORDER — SODIUM CHLORIDE 0.9 % IV SOLN
INTRAVENOUS | Status: DC
  Administered 2012-06-30: 09:00:00 via INTRAVENOUS
  Administered 2012-07-02: 50 mL/h via INTRAVENOUS
  Administered 2012-07-03: 13:00:00 via INTRAVENOUS
  Administered 2012-07-04: 50 mL/h via INTRAVENOUS
  Administered 2012-07-07: 01:00:00 via INTRAVENOUS

## 2012-06-30 NOTE — Clinical Social Work Psychosocial (Addendum)
    Clinical Social Work Department BRIEF PSYCHOSOCIAL ASSESSMENT 06/30/2012  Patient:  STEPHANOS, FAN     Account Number:  [id number]     Admit date:  06/23/2012  Clinical Social Worker:  Delmer Islam  Date/Time:  06/30/2012 04:50 AM  Referred by:  Physician  Date Referred:  06/30/2012 Referred for  SNF Placement   Other Referral:   Interview type:  Patient Other interview type:    PSYCHOSOCIAL DATA Living Status:  ALONE Admitted from facility:   Level of care:   Primary support name:   Primary support relationship to patient:   Degree of support available:   Unknown    CURRENT CONCERNS Current Concerns  Post-Acute Placement   Other Concerns:   Patient was at [facility], however no longer has a bed there.    SOCIAL WORK ASSESSMENT / PLAN CSW talked with patient regarding discharge plan and MD and PT concerns about his physical deconditioning and need for ST rehab. CSW explained my role in facilitating his discharge to a skilled nursing facility for ST rehab.    Patient reported that he had been at the [facility] and had been working, however he lost his job and no longer has a room at the [facility].    Prior to talking about placement post discharge, patient expressed his frustration at not being allowed to eat, that he was starving, and his frustrations with the doctors and his concerns about how her was being medically treated. CSW listened and offered support.    CSW explained SNF search process and gave him a SNF list for [facility].   Assessment/plan status:  Psychosocial Support/Ongoing Assessment of Needs Other assessment/ plan:   Information/referral to community resources:   SNF list for [facility] RESPONSE TO PLAN OF CARE: Patient is very frustrated, especially because he has not been allowed to eat. He is receptive to going to ST rehab, but asked CSW where he would go from there. CSW advised patient that a SW at the  facility could assist him housing resources.

## 2012-06-30 NOTE — Consult Note (Signed)
Infectious Diseases Initial Consultation  Reason for Consultation:  Possible uti   HPI: Manuel Lucas is a 56 y.o. male who is homeless, with probable CKD, presented to urgent care for  both lower extremities beginning swollen over the last 2 days prior to admit on 7/25. His work-up revealed  elevated creatinine and he was referred to the ER at [facility]. Patient in addition was found to be anemic with hemoglobin around 8.  Patient denies any chest pain or shortness of breath or abdominal pain nausea vomiting or diarrhea. Denies using any NSAID and hasn't noticed any black stools. AKI work-up revealed that he had moderate bilateral hydronephrosis concerning urinary bladder retention and/or bladder outlet obstruction.He was found to have obstructive uropathy when his bladder was decompressed with foley catheter. He had infectious work-up with ua, urine culture, and blood culture, HIV on admit which were all negative. During the last 2 days of hospitalization his WBC had increased from 10 to 18.4 concerning for infection. Not febrile during this time period. He underwent a pelvis Ct which showed significant pelvic lymphadenopathy of external iliac chain 4.4 x 7.3 cm on the right and 5.4 x 4.3 cm on left which is concerning for lymphoma vs. Metastatic prostate CA since his PSA > 100. Patient is mixed to giving consent for biopsy in order to further characterize LAN or prostate Ca. He was started on cipro on 7/30 but changed to ceftriaxone on 7/31 concerning for pyelonephritis  Abtx: Ceftriaxone Day #1 cipro 7/30 x 1  Past Medical History  Diagnosis Date  . No pertinent past medical history     Allergies: No Known Allergies  MEDICATIONS:    . amLODipine  10 mg Oral Daily  . calcitRIOL  0.25 mcg Oral QODAY  . cefTRIAXone (ROCEPHIN)  IV  1 g Intravenous Q24H  . darbepoetin (ARANESP) injection - NON-DIALYSIS  60 mcg Subcutaneous Q Wed-1800  . pantoprazole  40 mg Oral BID AC  . sodium chloride  3  mL Intravenous Q12H  . sodium chloride  3 mL Intravenous Q12H  . DISCONTD: ciprofloxacin  400 mg Intravenous Q24H    History  Substance Use Topics  . Smoking status: Never Smoker   . Smokeless tobacco: Not on file  . Alcohol Use: No  homeless, lives at [facility]  History reviewed. No pertinent family history.  Review of Systems  Per hpi, patient is not interested to answer all ROS questions  OBJECTIVE: Temp:  [98.1 F (36.7 C)-99.4 F (37.4 C)] 98.5 F (36.9 C) (07/31 0927) Pulse Rate:  [103-114] 103  (07/31 0927) Resp:  [18] 18  (07/31 0927) BP: (103-134)/(69-85) 117/77 mmHg (07/31 0927) SpO2:  [91 %-94 %] 94 % (07/31 0927) Weight:  [163 lb 9.3 oz (74.2 kg)] 163 lb 9.3 oz (74.2 kg) (07/30 2049)  General: Alert and awake, not in any acute distress. Not pleased with having to speak to another doctor HEENT: anicteric sclera, pupils reactive to light and accommodation, EOMI  CVS: S1-S2 clear, no murmur rubs or gallops  Chest: clear to auscultation bilaterally, no wheezing, rales or rhonchi  Abdomen: soft nontender, nondistended, normal bowel sounds, no organomegaly  Extremities: no cyanosis, clubbing or edema noted bilaterally        LABS: Results for orders placed during the hospital encounter of 06/23/12 (from the past 48 hour(s))  RENAL FUNCTION PANEL     Status: Abnormal   Collection Time   06/29/12  5:35 AM      Component Value  Range Comment   Sodium 135  135 - 145 mEq/L    Potassium 4.5  3.5 - 5.1 mEq/L    Chloride 96  96 - 112 mEq/L    CO2 23  19 - 32 mEq/L    Glucose, Bld 121 (*) 70 - 99 mg/dL    BUN 53 (*) 6 - 23 mg/dL    Creatinine, Ser 1.30 (*) 0.50 - 1.35 mg/dL    Calcium 9.6  8.4 - 86.5 mg/dL    Phosphorus 6.7 (*) 2.3 - 4.6 mg/dL    Albumin 3.1 (*) 3.5 - 5.2 g/dL    GFR calc non Af Amer 11 (*) >90 mL/min    GFR calc Af Amer 13 (*) >90 mL/min   CBC     Status: Abnormal   Collection Time   06/29/12  5:35 AM      Component Value Range Comment   WBC  18.4 (*) 4.0 - 10.5 K/uL    RBC 3.24 (*) 4.22 - 5.81 MIL/uL    Hemoglobin 9.0 (*) 13.0 - 17.0 g/dL    HCT 78.4 (*) 69.6 - 52.0 %    MCV 77.8 (*) 78.0 - 100.0 fL    MCH 27.8  26.0 - 34.0 pg    MCHC 35.7  30.0 - 36.0 g/dL    RDW 29.5  28.4 - 13.2 %    Platelets 201  150 - 400 K/uL   URINE CULTURE     Status: Normal   Collection Time   06/29/12  3:28 PM      Component Value Range Comment   Specimen Description URINE, CATHETERIZED      Special Requests NONE      Culture  Setup Time 06/29/2012 15:48      Colony Count NO GROWTH      Culture NO GROWTH      Report Status 06/30/2012 FINAL     URINALYSIS, ROUTINE W REFLEX MICROSCOPIC     Status: Abnormal   Collection Time   06/29/12  3:28 PM      Component Value Range Comment   Color, Urine RED (*) YELLOW BIOCHEMICALS MAY BE AFFECTED BY COLOR   APPearance CLOUDY (*) CLEAR    Specific Gravity, Urine 1.018  1.005 - 1.030    pH 5.5  5.0 - 8.0    Glucose, UA NEGATIVE  NEGATIVE mg/dL    Hgb urine dipstick LARGE (*) NEGATIVE    Bilirubin Urine NEGATIVE  NEGATIVE    Ketones, ur NEGATIVE  NEGATIVE mg/dL    Protein, ur >440 (*) NEGATIVE mg/dL    Urobilinogen, UA 1.0  0.0 - 1.0 mg/dL    Nitrite NEGATIVE  NEGATIVE    Leukocytes, UA SMALL (*) NEGATIVE   URINE MICROSCOPIC-ADD ON     Status: Abnormal   Collection Time   06/29/12  3:28 PM      Component Value Range Comment   Squamous Epithelial / LPF RARE  RARE    WBC, UA 11-20  <3 WBC/hpf    RBC / HPF TOO NUMEROUS TO COUNT  <3 RBC/hpf    Bacteria, UA FEW (*) RARE    Casts GRANULAR CAST (*) NEGATIVE    Urine-Other MUCOUS PRESENT     PROTIME-INR     Status: Abnormal   Collection Time   06/30/12  6:10 AM      Component Value Range Comment   Prothrombin Time 16.1 (*) 11.6 - 15.2 seconds    INR 1.26  0.00 - 1.49  CBC     Status: Abnormal   Collection Time   06/30/12  6:10 AM      Component Value Range Comment   WBC 17.6 (*) 4.0 - 10.5 K/uL    RBC 3.08 (*) 4.22 - 5.81 MIL/uL    Hemoglobin 8.6  (*) 13.0 - 17.0 g/dL    HCT 53.6 (*) 64.4 - 52.0 %    MCV 77.9 (*) 78.0 - 100.0 fL    MCH 27.9  26.0 - 34.0 pg    MCHC 35.8  30.0 - 36.0 g/dL    RDW 03.4  74.2 - 59.5 %    Platelets 195  150 - 400 K/uL   RENAL FUNCTION PANEL     Status: Abnormal   Collection Time   06/30/12  6:30 AM      Component Value Range Comment   Sodium 135  135 - 145 mEq/L    Potassium 4.4  3.5 - 5.1 mEq/L    Chloride 94 (*) 96 - 112 mEq/L    CO2 23  19 - 32 mEq/L    Glucose, Bld 112 (*) 70 - 99 mg/dL    BUN 64 (*) 6 - 23 mg/dL    Creatinine, Ser 6.38 (*) 0.50 - 1.35 mg/dL    Calcium 9.8  8.4 - 75.6 mg/dL    Phosphorus 6.8 (*) 2.3 - 4.6 mg/dL    Albumin 3.0 (*) 3.5 - 5.2 g/dL    GFR calc non Af Amer 11 (*) >90 mL/min    GFR calc Af Amer 12 (*) >90 mL/min     MICRO: Blood culture x 2 NGTD 7/25 Urine culture  NGTD 7/30 urine culture PENDING  IMAGING: Ct Pelvis Wo Contrast  06/28/2012  *RADIOLOGY REPORT*  Clinical Data:  Elevated PSA.  Suspected prostate cancer.  Anemia. The patient is refusing rectal in the examination.  CT PELVIS WITHOUT CONTRAST  Technique:  Multidetector CT imaging of the pelvis was performed following the standard protocol without intravenous contrast.  Comparison:  No priors.  Findings:  Pelvis:  A Foley balloon catheter is present within the lumen of the urinary bladder.  The urinary bladder is nearly completely decompressed.  Apparent wall thickening of the urinary bladder could simply be secondary to decompression, however, there is stranding in the perivesical fat, which could suggest inflammation or infiltration of the urinary bladder wall. There is also adjacent perivesical lymphadenopathy, with the largest single lymph node in the anterior aspect of the pelvis on the right measuring 1.2 cm in short axis (image 39 of series 2).  The prostate gland and does not appear to be enlarged on this noncontrast CT examination.  However, there is profound lymphadenopathy along the pelvic sidewalls  bilaterally, with a left external iliac lymph node measuring 5.4 x 4.3 cm, and a right external iliac lymph node measuring 4.3 x 7.3 cm.  These enlarged lymph nodes exert mass effect upon the adjacent arterial and venous structures, and there is stranding in the fat adjacent to the right external iliac artery and vein.  There appears to be multiple additional lymph nodes enlarged in the presacral space and common iliac regions bilaterally, including a right common iliac lymph node measuring 1.4 cm in short axis.  Musculoskeletal: There are no aggressive appearing lytic or blastic lesions noted in the visualized portions of the skeleton.  IMPRESSION: 1.  The prostate gland does not appear to be enlarged and is relatively unremarkable in appearance on this noncontrast CT examination. This does  not exclude the presence of prostate cancer. 2.  However, there is a profound lymphadenopathy in the pelvis, most notably in the external iliac chains bilaterally, measuring up to 4.4 x 7.3 cm on the right and 5.4 x 4.3 cm on the left. Clinical correlation for signs and symptoms of lymphoproliferative disorder is recommended. 3.  These lymph nodes exert mass effect upon adjacent vascular structures, particularly on the right, and there is extensive stranding around the right external iliac artery and vein.  This could suggest the presence of deep venous thrombosis.  Clinical correlation for signs and symptoms of right lower extremity edema is recommended.  If there is clinical concern for right lower extremity deep venous thrombosis, further evaluation with Doppler ultrasound may be warranted. 4.  A Foley balloon catheter is present within the lumen of the urinary bladder.  The urinary bladder appears nearly completely decompressed, but demonstrate some potential wall thickening and there are inflammatory changes surrounding the urinary bladder in the perivesical fat.  This could be secondary to inflammation or infiltration from  neoplasm.  Clinical correlation is recommended.  These results were called by telephone on 06/28/2012 at 04:50 p.m. to Dr. Isabel Caprice, who verbally acknowledged these results. Subsequently, findings were also discussed with Dr. Cena Benton by Dr. Llana Aliment on 06/28/2012 of 05:05 p.m.  Original Report Authenticated By: Florencia Reasons, M.D.    Assessment/Plan:  56yo Male who presents with AKI, obstructive uropathy, bilateral hydronephrosis who PSA > 100, and has significant pelvic lymphadenopathy concerning for lymphoproliferative disorder vs. Metastatic prostate CA currently on ceftriaxone for presumed urinary infection. Urine culture pending.  -- ua is not entirely suggestive of a UTI, only small leukesterase, negative nitrites. More impressive RBC and granular cast as it relates to his AKI. If this sample came from an indwelling catheter, it may represent an improper sample since it can easily colonize with bacteria.  -- Patient is currently on ceftriaxone, can discontinue and wait to see what is isolated from urinary source and assess clinical symptoms. He doesn't necessarily c/o dysuria.  -- Leukocytosis - could be leukamoid reaction to underlying presentation. Recommend to get cbc with diff.  -- Recommend to do pelvic node biopsy for path to determine etiology of LAN.  -- Patient is clinically stable and doesn't require empiric antibiotics at this time. If he has temp >100.3, recommend to repeat blood cultures. To get urine specimen, would need to change out foley catheter.  Dr. Luciana Axe to follow in the am to provide further recs. Thank you for consultation.  Duke Salvia Drue Second MD MPH [facility] [phone]

## 2012-06-30 NOTE — Progress Notes (Addendum)
Patient ID: Manuel Lucas  male  ZOX:[id number]    DOB: 1956/11/20    DOA: 06/23/2012  PCP: Sheila Oats, MD  Subjective: Resting in bed when examined earlier this morning, no specific complaints per patient. Denies any significant chest pain or shortness of breath, nausea vomiting.  Objective: Weight change: -1.9 kg (-4 lb 3 oz)  Intake/Output Summary (Last 24 hours) at 06/30/12 1206 Last data filed at 06/30/12 1100  Gross per 24 hour  Intake 716.67 ml  Output   1350 ml  Net -633.33 ml   Blood pressure 117/77, pulse 103, temperature 98.5 F (36.9 C), temperature source Oral, resp. rate 18, height 5\' 9"  (1.753 m), weight 74.2 kg (163 lb 9.3 oz), SpO2 94.00%.  Physical Exam: General: Alert and awake, not in any acute distress. HEENT: anicteric sclera, pupils reactive to light and accommodation, EOMI CVS: S1-S2 clear, no murmur rubs or gallops Chest: clear to auscultation bilaterally, no wheezing, rales or rhonchi Abdomen: soft nontender, nondistended, normal bowel sounds, no organomegaly Extremities: no cyanosis, clubbing or edema noted bilaterally Neuro: Cranial nerves II-XII intact, no focal neurological deficits  Lab Results: Basic Metabolic Panel:  Lab 06/30/12 8119 06/29/12 0535  NA 135 135  K 4.4 4.5  CL 94* 96  CO2 23 23  GLUCOSE 112* 121*  BUN 64* 53*  CREATININE 5.37* 5.20*  CALCIUM 9.8 9.6  MG -- --  PHOS 6.8* --   Liver Function Tests:  Lab 06/30/12 0630 06/29/12 0535 06/25/12 0600 06/23/12 2108  AST -- -- 18 16  ALT -- -- 12 13  ALKPHOS -- -- 38* 37*  BILITOT -- -- 0.3 0.3  PROT -- -- 7.2 7.6  ALBUMIN 3.0* 3.1* -- --   CBC:  Lab 06/30/12 0610 06/29/12 0535  WBC 17.6* 18.4*  NEUTROABS -- --  HGB 8.6* 9.0*  HCT 24.0* 25.2*  MCV 77.9* 77.8*  PLT 195 201   Cardiac Enzymes:  Lab 06/24/12 0800 06/23/12 1859  CKTOTAL 179 246*  CKMB 3.1 --  CKMBINDEX -- --  TROPONINI <0.30 --   BNP: No components found with this basename: POCBNP:2 CBG: No  results found for this basename: GLUCAP:5 in the last 168 hours   Micro Results: No results found for this or any previous visit (from the past 240 hour(s)).  Studies/Results: Dg Chest 2 View  06/23/2012  *RADIOLOGY REPORT*  Clinical Data: Acute renal failure  CHEST - 2 VIEW  Comparison: None.  Findings: Normal heart size.  Lungs are clear.  No pneumothorax and no pleural effusion.  IMPRESSION: No active cardiopulmonary disease.  Original Report Authenticated By: Donavan Burnet, M.D.   Ct Pelvis Wo Contrast  06/28/2012  *  IMPRESSION: 1.  The prostate gland does not appear to be enlarged and is relatively unremarkable in appearance on this noncontrast CT examination. This does not exclude the presence of prostate cancer. 2.  However, there is a profound lymphadenopathy in the pelvis, most notably in the external iliac chains bilaterally, measuring up to 4.4 x 7.3 cm on the right and 5.4 x 4.3 cm on the left. Clinical correlation for signs and symptoms of lymphoproliferative disorder is recommended. 3.  These lymph nodes exert mass effect upon adjacent vascular structures, particularly on the right, and there is extensive stranding around the right external iliac artery and vein.  This could suggest the presence of deep venous thrombosis.  Clinical correlation for signs and symptoms of right lower extremity edema is recommended.  If there is clinical concern  for right lower extremity deep venous thrombosis, further evaluation with Doppler ultrasound may be warranted. 4.  A Foley balloon catheter is present within the lumen of the urinary bladder.  The urinary bladder appears nearly completely decompressed, but demonstrate some potential wall thickening and there are inflammatory changes surrounding the urinary bladder in the perivesical fat.  This could be secondary to inflammation or infiltration from neoplasm.  Clinical correlation is recommended.  These results were called by telephone on 06/28/2012 at  04:50 p.m. to Dr. Isabel Caprice, who verbally acknowledged these results. Subsequently, findings were also discussed with Dr. Cena Benton by Dr. Llana Aliment on 06/28/2012 of 05:05 p.m.  Original Report Authenticated By: Florencia Reasons, M.D.   Nm Bone Scan Whole Body  06/27/2012  *RADIOLOGY REPORT*  Clinical Data: Elevated PSA.  NUCLEAR MEDICINE WHOLE BODY BONE SCINTIGRAPHY  Technique:  Whole body anterior and posterior images were obtained approximately 3 hours after intravenous injection of radiopharmaceutical.  Radiopharmaceutical: CURIE TC-MDP TECHNETIUM TC 52M MEDRONATE IV KIT  Comparison: ultrasound 06/24/2012.  Findings: There are mild degenerative changes but no findings suspicious for osseous metastatic disease.  Mild bilateral hydronephrosis is suspected.  IMPRESSION: Negative bone scan for osseous metastatic disease.  Original Report Authenticated By: P. Loralie Champagne, M.D.   US Renal  06/24/2012  *RADIOLOGY REPORT*  Clinical Data: Renal failure.  RENAL/URINARY TRACT ULTRASOUND COMPLETE  Comparison:  None.  Findings:  Right Kidney:  The right kidney measures 10.6 cm in length with slightly increased echotexture.  There is mild/moderate right hydronephrosis.  Left Kidney:  The left kidney measures 11.3 cm in length.  There is mild/moderate left hydronephrosis.  Bladder:  The urinary bladder is filled with urine.  There are bilateral ureteral jets.  IMPRESSION: Mild/moderate bilateral hydronephrosis.  The urinary bladder is distended and there are bilateral ureteral jets.  Findings raise concern for urinary bladder retention and/or bladder outlet obstruction.  Original Report Authenticated By: Richarda Overlie, M.D.    Medications: Scheduled Meds:   . amLODipine  10 mg Oral Daily  . calcitRIOL  0.25 mcg Oral QODAY  . ciprofloxacin  400 mg Intravenous Q24H  . darbepoetin (ARANESP) injection - NON-DIALYSIS  60 mcg Subcutaneous Q Wed-1800  . pantoprazole  40 mg Oral BID AC  . sodium chloride  3 mL  Intravenous Q12H  . sodium chloride  3 mL Intravenous Q12H   Continuous Infusions:   . sodium chloride 50 mL/hr at 06/30/12 1100     Assessment/Plan: Principal Problem:  *Obstructive uropathy with acute renal failure - Renal ultrasound with mild/moderate bilateral hydronephrosis, renal and urology following, continue Foley catheter - CT of the pelvis recently showed enlarged lymph nodes at concern for either metastatic prostate CA versus lymphoma - Unknown baseline creatinine, reviewed renal note today, started on IV fluids. - CT guided biopsy today of the right iliac lymph node.  Active Problems:  Anemia: Patient started on Aranesp weekly while inpatient   Elevated blood pressure/hypertension - Continue amlodipine, PRN hydralazine  Lymphadenopathy: See #1, CT guided biopsy today  Leukocytosis: Slightly improved today - Continue ciprofloxacin, so far blood culture, urine culture negative  DVT Prophylaxis: SCDs  Code Status: Full Disposition: Unclear, due to patient's homeless situation and will need followup care with urology, biopsy results and further management.   LOS: 7 days   Lalonnie Shaffer M.D. [facility] 06/30/2012, 12:06 PM Pager: [phone]  If 7PM-7AM, please contact night-coverage [email or web address] Password [email or web address]  Addendum: Unfortunately biopsy not done today as IR uncomfortable with biopsy  with leukocytosis. Patient is afebrile, urine culture and blood cultures still pending and negative from admission. As leukocytosis has not responded to ciprofloxacin, patient may have had resistant UTI to cipro, I will DC ciprofloxacin and place on IV Rocephin. Will obtain repeat renal ultrasound after Foley catheter placed and interval assessment of bilateral hydronephrosis. Placed ID consult.    Lajarvis Italiano M.D. [facility] 06/30/2012, 1:48 PM  Pager: [phone]

## 2012-06-30 NOTE — Progress Notes (Signed)
Physical Therapy Evaluation Patient Details Name: Manuel Lucas MRN: [id number] DOB: 02-Feb-1956 Today's Date: 06/30/2012 Time: 0865-7846 PT Time Calculation (min): 33 min  PT Assessment / Plan / Recommendation Clinical Impression  56 yo male admitted with Obstructive Uropathy/Renal Dysfunction presents with decr fucntional mobility and significantly  decreased activity tolerance;   Will benefit from PT to maximize independence and safety with mobility, amb, and work to increase activity tolerance to work towards pt's stated goal of back to work;   At current functional state, however, pt needs SNF for  further rehab     PT Assessment  Patient needs continued PT services    Follow Up Recommendations  Skilled nursing facility    Barriers to Discharge Decreased caregiver support Needs SNF for rehab to return to independence    Equipment Recommendations  Defer to next venue    Recommendations for Other Services     Frequency Min 3X/week    Precautions / Restrictions Precautions Precautions: Fall   Pertinent Vitals/Pain Significant lower abdominal pain; pt did not rate; he declined pain meds      Mobility  Bed Mobility Bed Mobility: Supine to Sit;Sitting - Scoot to Edge of Bed Supine to Sit: 4: Min assist;With rails Sitting - Scoot to Edge of Bed: 4: Min guard;With rail Details for Bed Mobility Assistance: Extremely slow moving secondary to lower abdominal pain; Cues to use sidelie to sit technique in hopes of minimizing pain Transfers Transfers: Sit to Stand;Stand to Sit Sit to Stand: From bed;3: Mod assist Stand to Sit: 4: Min assist;To chair/3-in-1;With armrests;With upper extremity assist Details for Transfer Assistance: Cues for safe hand placement; Still required physical assist to steady RW secondary to heavy dependence on UE push for successful sit to stand; Cues to control descent  Ambulation/Gait Ambulation/Gait Assistance: 4: Min assist Ambulation Distance (Feet):  2 Feet Assistive device: Rolling walker Ambulation/Gait Assistance Details: Very painful, forward flexed posture; Decr activity tolerance, especially in standing Gait Pattern: Decreased step length - left;Decreased step length - right;Trunk flexed    Exercises     PT Diagnosis: Difficulty walking;Acute pain  PT Problem List: Decreased strength;Decreased activity tolerance;Decreased balance;Decreased mobility;Decreased coordination;Decreased knowledge of use of DME;Pain PT Treatment Interventions: DME instruction;Gait training;Stair training;Functional mobility training;Therapeutic activities;Therapeutic exercise;Patient/family education   PT Goals Acute Rehab PT Goals PT Goal Formulation: With patient Time For Goal Achievement: 07/14/12 Potential to Achieve Goals: Good Pt will go Supine/Side to Sit: with modified independence PT Goal: Supine/Side to Sit - Progress: Goal set today Pt will go Sit to Supine/Side: with modified independence PT Goal: Sit to Supine/Side - Progress: Goal set today Pt will go Sit to Stand: with modified independence PT Goal: Sit to Stand - Progress: Goal set today Pt will go Stand to Sit: with modified independence PT Goal: Stand to Sit - Progress: Goal set today Pt will Ambulate: >150 feet;with supervision;with least restrictive assistive device PT Goal: Ambulate - Progress: Goal set today Pt will Go Up / Down Stairs: 3-5 stairs;with min assist;with rail(s) PT Goal: Up/Down Stairs - Progress: Goal set today  Visit Information  Last PT Received On: 06/30/12 Assistance Needed: +1    Subjective Data  Subjective: "I'll work with you" Patient Stated Goal: back to work   Prior [employer] Living Lives With:  (experiencing homelessness; stayed at [facility] ) Available Help at Discharge:  (not sure if any help is available) Type of Home: Homeless Home Adaptive Equipment: None Prior Function Level of Independence:  Independent Communication  Communication: No difficulties    Cognition  Overall Cognitive Status: Appears within functional limits for tasks assessed/performed Arousal/Alertness: Awake/alert Orientation Level: Oriented X4 / Intact Behavior During Session: [facility] for tasks performed    Extremity/Trunk Assessment Right Upper Extremity Assessment RUE ROM/Strength/Tone: Within functional levels Left Upper Extremity Assessment LUE ROM/Strength/Tone: Within functional levels Right Lower Extremity Assessment RLE ROM/Strength/Tone: Deficits;Due to pain RLE ROM/Strength/Tone Deficits: Generally weak, requiring heavy dependence on UE support for sit to stand Left Lower Extremity Assessment LLE ROM/Strength/Tone: Deficits;Due to pain LLE ROM/Strength/Tone Deficits: Generally weak, requiring heavy dependence on UE support for sit to stand   Balance    End of Session PT - End of Session Activity Tolerance: Patient limited by pain Patient left: in chair;with call bell/phone within reach Nurse Communication: Mobility status  GP     Van Clines [facility] [place], [place] [phone]  06/30/2012, 1:09 PM

## 2012-06-30 NOTE — Progress Notes (Signed)
Patient ID: Manuel Lucas, male   DOB: [birth year], 56 y.o.   MRN: [id number] Request received for CT guided biopsy of rt iliac lymph node today on pt. Imaging studies were reviewed by Dr. Bonnielee Haff and node is amenable to biopsy. Exam- Chest- distant BS but clear, heart- tachy, reg rhythm; abd- sl distended, diffuse tenderness to palp, few BS. Past Medical History  Diagnosis Date  . No pertinent past medical history   ARF, HTN, obstructive uropathy Past Surgical History  Procedure Date  . No past surgeries   Dg Chest 2 View  06/23/2012  *RADIOLOGY REPORT*  Clinical Data: Acute renal failure  CHEST - 2 VIEW  Comparison: None.  Findings: Normal heart size.  Lungs are clear.  No pneumothorax and no pleural effusion.  IMPRESSION: No active cardiopulmonary disease.  Original Report Authenticated By: Donavan Burnet, M.D.   Ct Pelvis Wo Contrast  06/28/2012  *RADIOLOGY REPORT*  Clinical Data:  Elevated PSA.  Suspected prostate cancer.  Anemia. The patient is refusing rectal in the examination.  CT PELVIS WITHOUT CONTRAST  Technique:  Multidetector CT imaging of the pelvis was performed following the standard protocol without intravenous contrast.  Comparison:  No priors.  Findings:  Pelvis:  A Foley balloon catheter is present within the lumen of the urinary bladder.  The urinary bladder is nearly completely decompressed.  Apparent wall thickening of the urinary bladder could simply be secondary to decompression, however, there is stranding in the perivesical fat, which could suggest inflammation or infiltration of the urinary bladder wall. There is also adjacent perivesical lymphadenopathy, with the largest single lymph node in the anterior aspect of the pelvis on the right measuring 1.2 cm in short axis (image 39 of series 2).  The prostate gland and does not appear to be enlarged on this noncontrast CT examination.  However, there is profound lymphadenopathy along the pelvic sidewalls bilaterally, with a left  external iliac lymph node measuring 5.4 x 4.3 cm, and a right external iliac lymph node measuring 4.3 x 7.3 cm.  These enlarged lymph nodes exert mass effect upon the adjacent arterial and venous structures, and there is stranding in the fat adjacent to the right external iliac artery and vein.  There appears to be multiple additional lymph nodes enlarged in the presacral space and common iliac regions bilaterally, including a right common iliac lymph node measuring 1.4 cm in short axis.  Musculoskeletal: There are no aggressive appearing lytic or blastic lesions noted in the visualized portions of the skeleton.  IMPRESSION: 1.  The prostate gland does not appear to be enlarged and is relatively unremarkable in appearance on this noncontrast CT examination. This does not exclude the presence of prostate cancer. 2.  However, there is a profound lymphadenopathy in the pelvis, most notably in the external iliac chains bilaterally, measuring up to 4.4 x 7.3 cm on the right and 5.4 x 4.3 cm on the left. Clinical correlation for signs and symptoms of lymphoproliferative disorder is recommended. 3.  These lymph nodes exert mass effect upon adjacent vascular structures, particularly on the right, and there is extensive stranding around the right external iliac artery and vein.  This could suggest the presence of deep venous thrombosis.  Clinical correlation for signs and symptoms of right lower extremity edema is recommended.  If there is clinical concern for right lower extremity deep venous thrombosis, further evaluation with Doppler ultrasound may be warranted. 4.  A Foley balloon catheter is present within the lumen of the  urinary bladder.  The urinary bladder appears nearly completely decompressed, but demonstrate some potential wall thickening and there are inflammatory changes surrounding the urinary bladder in the perivesical fat.  This could be secondary to inflammation or infiltration from neoplasm.  Clinical  correlation is recommended.  These results were called by telephone on 06/28/2012 at 04:50 p.m. to Dr. Isabel Caprice, who verbally acknowledged these results. Subsequently, findings were also discussed with Dr. Cena Benton by Dr. Llana Aliment on 06/28/2012 of 05:05 p.m.  Original Report Authenticated By: Florencia Reasons, M.D.   Nm Bone Scan Whole Body  06/27/2012  *RADIOLOGY REPORT*  Clinical Data: Elevated PSA.  NUCLEAR MEDICINE WHOLE BODY BONE SCINTIGRAPHY  Technique:  Whole body anterior and posterior images were obtained approximately 3 hours after intravenous injection of radiopharmaceutical.  Radiopharmaceutical: CURIE TC-MDP TECHNETIUM TC 36M MEDRONATE IV KIT  Comparison: ultrasound 06/24/2012.  Findings: There are mild degenerative changes but no findings suspicious for osseous metastatic disease.  Mild bilateral hydronephrosis is suspected.  IMPRESSION: Negative bone scan for osseous metastatic disease.  Original Report Authenticated By: P. Loralie Champagne, M.D.   US Renal  06/24/2012  *RADIOLOGY REPORT*  Clinical Data: Renal failure.  RENAL/URINARY TRACT ULTRASOUND COMPLETE  Comparison:  None.  Findings:  Right Kidney:  The right kidney measures 10.6 cm in length with slightly increased echotexture.  There is mild/moderate right hydronephrosis.  Left Kidney:  The left kidney measures 11.3 cm in length.  There is mild/moderate left hydronephrosis.  Bladder:  The urinary bladder is filled with urine.  There are bilateral ureteral jets.  IMPRESSION: Mild/moderate bilateral hydronephrosis.  The urinary bladder is distended and there are bilateral ureteral jets.  Findings raise concern for urinary bladder retention and/or bladder outlet obstruction.  Original Report Authenticated By: Richarda Overlie, M.D.  Results for orders placed during the hospital encounter of 06/23/12  CBC      Component Value Range   WBC 8.9  4.0 - 10.5 K/uL   RBC 2.93 (*) 4.22 - 5.81 MIL/uL   Hemoglobin 8.0 (*) 13.0 - 17.0 g/dL   HCT 16.1  (*) 09.6 - 52.0 %   MCV 78.5  78.0 - 100.0 fL   MCH 27.3  26.0 - 34.0 pg   MCHC 34.8  30.0 - 36.0 g/dL   RDW 04.5  40.9 - 81.1 %   Platelets 171  150 - 400 K/uL  BASIC METABOLIC PANEL      Component Value Range   Sodium 139  135 - 145 mEq/L   Potassium 4.9  3.5 - 5.1 mEq/L   Chloride 101  96 - 112 mEq/L   CO2 24  19 - 32 mEq/L   Glucose, Bld 130 (*) 70 - 99 mg/dL   BUN 57 (*) 6 - 23 mg/dL   Creatinine, Ser 9.14 (*) 0.50 - 1.35 mg/dL   Calcium 9.7  8.4 - 78.2 mg/dL   GFR calc non Af Amer 10 (*) >90 mL/min   GFR calc Af Amer 11 (*) >90 mL/min  URINALYSIS, ROUTINE W REFLEX MICROSCOPIC      Component Value Range   Color, Urine YELLOW  YELLOW   APPearance CLEAR  CLEAR   Specific Gravity, Urine 1.006  1.005 - 1.030   pH 6.0  5.0 - 8.0   Glucose, UA NEGATIVE  NEGATIVE mg/dL   Hgb urine dipstick NEGATIVE  NEGATIVE   Bilirubin Urine NEGATIVE  NEGATIVE   Ketones, ur NEGATIVE  NEGATIVE mg/dL   Protein, ur NEGATIVE  NEGATIVE mg/dL  Urobilinogen, UA 0.2  0.0 - 1.0 mg/dL   Nitrite NEGATIVE  NEGATIVE   Leukocytes, UA NEGATIVE  NEGATIVE  TYPE AND SCREEN      Component Value Range   ABO/RH(D) B POS     Antibody Screen NEG     Sample Expiration 06/26/2012    CK      Component Value Range   Total CK 246 (*) 7 - 232 U/L  LACTIC ACID, PLASMA      Component Value Range   Lactic Acid, Venous 1.0  0.5 - 2.2 mmol/L  OCCULT BLOOD, POC DEVICE      Component Value Range   Fecal Occult Bld POSITIVE    ABO/RH      Component Value Range   ABO/RH(D) B POS    HEPATIC FUNCTION PANEL      Component Value Range   Total Protein 7.6  6.0 - 8.3 g/dL   Albumin 3.5  3.5 - 5.2 g/dL   AST 16  0 - 37 U/L   ALT 13  0 - 53 U/L   Alkaline Phosphatase 37 (*) 39 - 117 U/L   Total Bilirubin 0.3  0.3 - 1.2 mg/dL   Bilirubin, Direct <5.7  0.0 - 0.3 mg/dL   Indirect Bilirubin NOT CALCULATED  0.3 - 0.9 mg/dL  CBC      Component Value Range   WBC 7.6  4.0 - 10.5 K/uL   RBC 2.71 (*) 4.22 - 5.81 MIL/uL    Hemoglobin 7.3 (*) 13.0 - 17.0 g/dL   HCT 84.6 (*) 96.2 - 95.2 %   MCV 78.2  78.0 - 100.0 fL   MCH 26.9  26.0 - 34.0 pg   MCHC 34.4  30.0 - 36.0 g/dL   RDW 84.1  32.4 - 40.1 %   Platelets 156  150 - 400 K/uL  PHOSPHORUS      Component Value Range   Phosphorus 5.0 (*) 2.3 - 4.6 mg/dL  VITAMIN U27      Component Value Range   Vitamin B-12 507  211 - 911 pg/mL  FOLATE      Component Value Range   Folate >20.0    IRON AND TIBC      Component Value Range   Iron 65  42 - 135 ug/dL   TIBC 253 (*) 664 - 403 ug/dL   Saturation Ratios 32  20 - 55 %   UIBC 138  125 - 400 ug/dL  FERRITIN      Component Value Range   Ferritin 617 (*) 22 - 322 ng/mL  RETICULOCYTES      Component Value Range   Retic Ct Pct 1.1  0.4 - 3.1 %   RBC. 2.57 (*) 4.22 - 5.81 MIL/uL   Retic Count, Manual 28.3  19.0 - 186.0 K/uL  COMPREHENSIVE METABOLIC PANEL      Component Value Range   Sodium 139  135 - 145 mEq/L   Potassium 4.9  3.5 - 5.1 mEq/L   Chloride 104  96 - 112 mEq/L   CO2 23  19 - 32 mEq/L   Glucose, Bld 97  70 - 99 mg/dL   BUN 57 (*) 6 - 23 mg/dL   Creatinine, Ser 4.74 (*) 0.50 - 1.35 mg/dL   Calcium 8.8  8.4 - 25.9 mg/dL   Total Protein 7.2  6.0 - 8.3 g/dL   Albumin 3.3 (*) 3.5 - 5.2 g/dL   AST 18  0 - 37 U/L   ALT 12  0 - 53 U/L   Alkaline Phosphatase 38 (*) 39 - 117 U/L   Total Bilirubin 0.3  0.3 - 1.2 mg/dL   GFR calc non Af Amer 8 (*) >90 mL/min   GFR calc Af Amer 9 (*) >90 mL/min  CARDIAC PANEL(CRET KIN+CKTOT+MB+TROPI)      Component Value Range   Total CK 179  7 - 232 U/L   CK, MB 3.1  0.3 - 4.0 ng/mL   Troponin I <0.30  <0.30 ng/mL   Relative Index 1.7  0.0 - 2.5  CBC      Component Value Range   WBC 6.7  4.0 - 10.5 K/uL   RBC 2.57 (*) 4.22 - 5.81 MIL/uL   Hemoglobin 7.1 (*) 13.0 - 17.0 g/dL   HCT 40.9 (*) 81.1 - 91.4 %   MCV 78.6  78.0 - 100.0 fL   MCH 27.6  26.0 - 34.0 pg   MCHC 35.1  30.0 - 36.0 g/dL   RDW 78.2  95.6 - 21.3 %   Platelets 136 (*) 150 - 400 K/uL  HIV  ANTIBODY (ROUTINE TESTING)      Component Value Range   HIV NON REACTIVE  NON REACTIVE  HEPATITIS B SURFACE ANTIGEN      Component Value Range   Hepatitis B Surface Ag NEGATIVE  NEGATIVE  HEPATITIS C VRS RNA DETECT BY PCR-QUAL      Component Value Range   Hepatitis C Vrs RNA by PCR-Qual NEGATIVE  Negative  PARATHYROID HORMONE, INTACT (NO CA)      Component Value Range   PTH 211.4 (*) 14.0 - 72.0 pg/mL  CBC      Component Value Range   WBC 5.7  4.0 - 10.5 K/uL   RBC 2.66 (*) 4.22 - 5.81 MIL/uL   Hemoglobin 7.2 (*) 13.0 - 17.0 g/dL   HCT 08.6 (*) 57.8 - 46.9 %   MCV 78.2  78.0 - 100.0 fL   MCH 27.1  26.0 - 34.0 pg   MCHC 34.6  30.0 - 36.0 g/dL   RDW 62.9  52.8 - 41.3 %   Platelets 152  150 - 400 K/uL  PSA      Component Value Range   PSA 106.20 (*) <=4.00 ng/mL  BASIC METABOLIC PANEL      Component Value Range   Sodium 139  135 - 145 mEq/L   Potassium 5.4 (*) 3.5 - 5.1 mEq/L   Chloride 102  96 - 112 mEq/L   CO2 24  19 - 32 mEq/L   Glucose, Bld 127 (*) 70 - 99 mg/dL   BUN 53 (*) 6 - 23 mg/dL   Creatinine, Ser 2.44 (*) 0.50 - 1.35 mg/dL   Calcium 9.1  8.4 - 01.0 mg/dL   GFR calc non Af Amer 9 (*) >90 mL/min   GFR calc Af Amer 11 (*) >90 mL/min  RENAL FUNCTION PANEL      Component Value Range   Sodium 137  135 - 145 mEq/L   Potassium 5.2 (*) 3.5 - 5.1 mEq/L   Chloride 100  96 - 112 mEq/L   CO2 22  19 - 32 mEq/L   Glucose, Bld 120 (*) 70 - 99 mg/dL   BUN 50 (*) 6 - 23 mg/dL   Creatinine, Ser 2.72 (*) 0.50 - 1.35 mg/dL   Calcium 9.6  8.4 - 53.6 mg/dL   Phosphorus 5.9 (*) 2.3 - 4.6 mg/dL   Albumin 3.6  3.5 - 5.2 g/dL  GFR calc non Af Amer 10 (*) >90 mL/min   GFR calc Af Amer 12 (*) >90 mL/min  CBC      Component Value Range   WBC 9.8  4.0 - 10.5 K/uL   RBC 3.35 (*) 4.22 - 5.81 MIL/uL   Hemoglobin 9.3 (*) 13.0 - 17.0 g/dL   HCT 40.9 (*) 81.1 - 91.4 %   MCV 77.9 (*) 78.0 - 100.0 fL   MCH 27.8  26.0 - 34.0 pg   MCHC 35.6  30.0 - 36.0 g/dL   RDW 78.2  95.6 - 21.3 %     Platelets 196  150 - 400 K/uL  RENAL FUNCTION PANEL      Component Value Range   Sodium 135  135 - 145 mEq/L   Potassium 5.5 (*) 3.5 - 5.1 mEq/L   Chloride 98  96 - 112 mEq/L   CO2 25  19 - 32 mEq/L   Glucose, Bld 120 (*) 70 - 99 mg/dL   BUN 51 (*) 6 - 23 mg/dL   Creatinine, Ser 0.86 (*) 0.50 - 1.35 mg/dL   Calcium 9.6  8.4 - 57.8 mg/dL   Phosphorus 5.1 (*) 2.3 - 4.6 mg/dL   Albumin 3.6  3.5 - 5.2 g/dL   GFR calc non Af Amer 11 (*) >90 mL/min   GFR calc Af Amer 12 (*) >90 mL/min  RENAL FUNCTION PANEL      Component Value Range   Sodium 135  135 - 145 mEq/L   Potassium 4.5  3.5 - 5.1 mEq/L   Chloride 96  96 - 112 mEq/L   CO2 23  19 - 32 mEq/L   Glucose, Bld 121 (*) 70 - 99 mg/dL   BUN 53 (*) 6 - 23 mg/dL   Creatinine, Ser 4.69 (*) 0.50 - 1.35 mg/dL   Calcium 9.6  8.4 - 62.9 mg/dL   Phosphorus 6.7 (*) 2.3 - 4.6 mg/dL   Albumin 3.1 (*) 3.5 - 5.2 g/dL   GFR calc non Af Amer 11 (*) >90 mL/min   GFR calc Af Amer 13 (*) >90 mL/min  CBC      Component Value Range   WBC 18.4 (*) 4.0 - 10.5 K/uL   RBC 3.24 (*) 4.22 - 5.81 MIL/uL   Hemoglobin 9.0 (*) 13.0 - 17.0 g/dL   HCT 52.8 (*) 41.3 - 24.4 %   MCV 77.8 (*) 78.0 - 100.0 fL   MCH 27.8  26.0 - 34.0 pg   MCHC 35.7  30.0 - 36.0 g/dL   RDW 01.0  27.2 - 53.6 %   Platelets 201  150 - 400 K/uL  URINALYSIS, ROUTINE W REFLEX MICROSCOPIC      Component Value Range   Color, Urine RED (*) YELLOW   APPearance CLOUDY (*) CLEAR   Specific Gravity, Urine 1.018  1.005 - 1.030   pH 5.5  5.0 - 8.0   Glucose, UA NEGATIVE  NEGATIVE mg/dL   Hgb urine dipstick LARGE (*) NEGATIVE   Bilirubin Urine NEGATIVE  NEGATIVE   Ketones, ur NEGATIVE  NEGATIVE mg/dL   Protein, ur >644 (*) NEGATIVE mg/dL   Urobilinogen, UA 1.0  0.0 - 1.0 mg/dL   Nitrite NEGATIVE  NEGATIVE   Leukocytes, UA SMALL (*) NEGATIVE  URINE MICROSCOPIC-ADD ON      Component Value Range   Squamous Epithelial / LPF RARE  RARE   WBC, UA 11-20  <3 WBC/hpf   RBC / HPF TOO NUMEROUS  TO COUNT  <  3 RBC/hpf   Bacteria, UA FEW (*) RARE   Casts GRANULAR CAST (*) NEGATIVE   Urine-Other MUCOUS PRESENT    RENAL FUNCTION PANEL      Component Value Range   Sodium 135  135 - 145 mEq/L   Potassium 4.4  3.5 - 5.1 mEq/L   Chloride 94 (*) 96 - 112 mEq/L   CO2 23  19 - 32 mEq/L   Glucose, Bld 112 (*) 70 - 99 mg/dL   BUN 64 (*) 6 - 23 mg/dL   Creatinine, Ser 1.61 (*) 0.50 - 1.35 mg/dL   Calcium 9.8  8.4 - 09.6 mg/dL   Phosphorus 6.8 (*) 2.3 - 4.6 mg/dL   Albumin 3.0 (*) 3.5 - 5.2 g/dL   GFR calc non Af Amer 11 (*) >90 mL/min   GFR calc Af Amer 12 (*) >90 mL/min  PROTIME-INR      Component Value Range   Prothrombin Time 16.1 (*) 11.6 - 15.2 seconds   INR 1.26  0.00 - 1.49  CBC      Component Value Range   WBC 17.6 (*) 4.0 - 10.5 K/uL   RBC 3.08 (*) 4.22 - 5.81 MIL/uL   Hemoglobin 8.6 (*) 13.0 - 17.0 g/dL   HCT 04.5 (*) 40.9 - 81.1 %   MCV 77.9 (*) 78.0 - 100.0 fL   MCH 27.9  26.0 - 34.0 pg   MCHC 35.8  30.0 - 36.0 g/dL   RDW 91.4  78.2 - 95.6 %   Platelets 195  150 - 400 K/uL    A/P: Pt with history of acute renal failure, obstructive uropathy, anemia, abd /pelvic pain and iliac adenopathy on recent CT; plan is for CT guided biopsy of a rt iliac LN today. Details/risks of procedure d/w pt with his understanding and consent.

## 2012-06-30 NOTE — Progress Notes (Signed)
Patient ID: Manuel Lucas, male   DOB: 12-Jul-1956, 56 y.o.   MRN: [id number] Patient has a WBC of 17k. Cultures so far negative. Rec [place] of kidneys to assess for hydronephrosis. Recommend holding off on biopsy until leukocytosis resolved.  AAH

## 2012-06-30 NOTE — Progress Notes (Addendum)
Subjective:  "They never gave me any IV fluids" (and in fact this is true - the order was not acknowledged and he is getting no fluids Objective Vital signs in last 24 hours: Filed Vitals:   06/29/12 1449 06/29/12 1702 06/29/12 2049 06/30/12 0524  BP: 123/81 126/85 103/69 134/77  Pulse: 104 105 114 107  Temp: 99.4 F (37.4 C) 98.1 F (36.7 C) 99.4 F (37.4 C) 98.6 F (37 C)  TempSrc: Oral Oral Oral Oral  Resp: 18 18 18 18   Height:   5\' 9"  (1.753 m)   Weight:   74.2 kg (163 lb 9.3 oz)   SpO2:  91% 93% 93%   Weight change: -1.9 kg (-4 lb 3 oz)  Intake/Output Summary (Last 24 hours) at 06/30/12 0816 Last data filed at 06/30/12 0601  Gross per 24 hour  Intake    715 ml  Output   1550 ml  Net   -835 ml   Physical Exam:  Blood pressure 134/77, pulse 107, temperature 98.6 F (37 C), temperature source Oral, resp. rate 18, height 5\' 9"  (1.753 m), weight 74.2 kg (163 lb 9.3 oz), SpO2 93.00%. Looks dry Poor tissue turgor Tacky MM's Clear lungs No edema Foley urine clearing some Basic Metabolic Panel:  Lab 06/30/12 1914 06/29/12 0535 06/28/12 0600 06/27/12 0600 06/26/12 1124 06/25/12 0600 06/24/12 0615 06/23/12 1511  NA 135 135 135 137 139 139 -- 139  K 4.4 4.5 5.5* 5.2* 5.4* 4.9 -- 4.9  CL 94* 96 98 100 102 104 -- 101  CO2 23 23 25 22 24 23  -- 24  GLUCOSE 112* 121* 120* 120* 127* 97 -- 130*  BUN 64* 53* 51* 50* 53* 57* -- 57*  CREATININE 5.37* 5.20* 5.35* 5.53* 5.97* 6.87* -- 5.88*  ALB -- -- -- -- -- -- -- --  CALCIUM 9.8 9.6 9.6 9.6 9.1 8.8 -- 9.7  PHOS 6.8* 6.7* 5.1* 5.9* -- -- 5.0* --   Liver Function Tests:  Lab 06/30/12 0630 06/29/12 0535 06/28/12 0600 06/25/12 0600 06/23/12 2108  AST -- -- -- 18 16  ALT -- -- -- 12 13  ALKPHOS -- -- -- 38* 37*  BILITOT -- -- -- 0.3 0.3  PROT -- -- -- 7.2 7.6  ALBUMIN 3.0* 3.1* 3.6 -- --   No results found for this basename: LIPASE:3,AMYLASE:3 in the last 168 hours No results found for this basename: AMMONIA:3 in the last 168  hours CBC:  Lab 06/30/12 0610 06/29/12 0535 06/27/12 0600 06/25/12 0600  WBC 17.6* 18.4* 9.8 5.7  NEUTROABS -- -- -- --  HGB 8.6* 9.0* 9.3* 7.2*  HCT 24.0* 25.2* 26.1* 20.8*  MCV 77.9* 77.8* 77.9* 78.2  PLT 195 201 196 152   Iron Studies:  Lab 06/24/12 0757  IRON 65  TIBC 203*  TRANSFERRIN --  FERRITIN 617*   Studies/Results: Ct Pelvis Wo Contrast  06/28/2012  *RADIOLOGY REPORT*  Clinical Data:  Elevated PSA.  Suspected prostate cancer.  Anemia. The patient is refusing rectal in the examination.  CT PELVIS WITHOUT CONTRAST  Technique:  Multidetector CT imaging of the pelvis was performed following the standard protocol without intravenous contrast.  Comparison:  No priors.  Findings:  Pelvis:  A Foley balloon catheter is present within the lumen of the urinary bladder.  The urinary bladder is nearly completely decompressed.  Apparent wall thickening of the urinary bladder could simply be secondary to decompression, however, there is stranding in the perivesical fat, which could suggest inflammation or  infiltration of the urinary bladder wall. There is also adjacent perivesical lymphadenopathy, with the largest single lymph node in the anterior aspect of the pelvis on the right measuring 1.2 cm in short axis (image 39 of series 2).  The prostate gland and does not appear to be enlarged on this noncontrast CT examination.  However, there is profound lymphadenopathy along the pelvic sidewalls bilaterally, with a left external iliac lymph node measuring 5.4 x 4.3 cm, and a right external iliac lymph node measuring 4.3 x 7.3 cm.  These enlarged lymph nodes exert mass effect upon the adjacent arterial and venous structures, and there is stranding in the fat adjacent to the right external iliac artery and vein.  There appears to be multiple additional lymph nodes enlarged in the presacral space and common iliac regions bilaterally, including a right common iliac lymph node measuring 1.4 cm in short  axis.  Musculoskeletal: There are no aggressive appearing lytic or blastic lesions noted in the visualized portions of the skeleton.  IMPRESSION: 1.  The prostate gland does not appear to be enlarged and is relatively unremarkable in appearance on this noncontrast CT examination. This does not exclude the presence of prostate cancer. 2.  However, there is a profound lymphadenopathy in the pelvis, most notably in the external iliac chains bilaterally, measuring up to 4.4 x 7.3 cm on the right and 5.4 x 4.3 cm on the left. Clinical correlation for signs and symptoms of lymphoproliferative disorder is recommended. 3.  These lymph nodes exert mass effect upon adjacent vascular structures, particularly on the right, and there is extensive stranding around the right external iliac artery and vein.  This could suggest the presence of deep venous thrombosis.  Clinical correlation for signs and symptoms of right lower extremity edema is recommended.  If there is clinical concern for right lower extremity deep venous thrombosis, further evaluation with Doppler ultrasound may be warranted. 4.  A Foley balloon catheter is present within the lumen of the urinary bladder.  The urinary bladder appears nearly completely decompressed, but demonstrate some potential wall thickening and there are inflammatory changes surrounding the urinary bladder in the perivesical fat.  This could be secondary to inflammation or infiltration from neoplasm.  Clinical correlation is recommended.  These results were called by telephone on 06/28/2012 at 04:50 p.m. to Dr. Isabel Caprice, who verbally acknowledged these results. Subsequently, findings were also discussed with Dr. Cena Benton by Dr. Llana Aliment on 06/28/2012 of 05:05 p.m.  Original Report Authenticated By: Florencia Reasons, M.D.   Medications:   . amLODipine  10 mg Oral Daily  . calcitRIOL  0.25 mcg Oral QODAY  . ciprofloxacin  400 mg Intravenous Q24H  . pantoprazole  40 mg Oral BID AC  . sodium  chloride  3 mL Intravenous Q12H  . sodium chloride  3 mL Intravenous Q12H     I  have reviewed scheduled and prn medications.  ASSESSMENT/RECS   1. Renal failure - unknown baseline creatinine. 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 With foley drainage there is some improvement in renal function, but how much recovery he will get is totally unknown. Kidneys are somewhat echodense, UA is bland. May have some underlying CKD independent of the obstructive process. I have not pursued further discussion with him about vascular access for dialysis - since he is finally agreeing to some eval/Rx for his cancer don't want to overwhelm him into changing his mind and am pleasantly surprised that there appears to be a continued slow improvement in  his renal function. Still looks dry to me. Normal saline was not administered as ordered.  I have spoken with nursing to get the IVF going. Creatinine is plateauing in the mid 5's.  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 Anemia - Tsat OK. Start Aranesp 60/week (will likely not be able to get as outpt) (7/31)  4. Prostate cancer - Dr. Ellin Goodie note reviewed. CT reviewed. Await further input from urology. Lymph node biopsy ordered - patient is presently refusing.  5. Leukocytosis - urine culture pending.  Cipro (7/30)  6. Dispo - ? Where he will go - says he is homeless.  Camille Bal, MD [facility] [phone] pager 06/30/2012, 8:16 AM

## 2012-06-30 NOTE — Clinical Social Work Placement (Addendum)
    Clinical Social Work Department CLINICAL SOCIAL WORK PLACEMENT NOTE 06/30/2012  Patient:  Manuel Lucas, Manuel Lucas  Account Number:  [id number] Admit date:  06/23/2012  Clinical Social Worker:  Genelle Bal, LCSW  Date/time:  06/30/2012 05:01 AM  Clinical Social Work is seeking post-discharge placement for this patient at the following level of care:   SKILLED NURSING   (*CSW will update this form in Epic as items are completed)   06/30/2012  Patient/family provided with [facility] Department of Clinical Social Work's list of facilities offering this level of care within the geographic area requested by the patient (or if unable, by the patient's family).  06/30/2012  Patient/family informed of their freedom to choose among providers that offer the needed level of care, that participate in Medicare, Medicaid or managed care program needed by the patient, have an available bed and are willing to accept the patient.    Patient/family informed of [facility]' ownership interest in [facility], as well as of the fact that they are under no obligation to receive care at this facility.  PASARR submitted to EDS on 06/30/12 PASARR number received from EDS on 06/30/12  FL2 transmitted to all facilities in geographic area requested by pt/family on  06/30/2012 FL2 transmitted to all facilities within larger geographic area on   Patient informed that his/her managed care company has contracts with or will negotiate with  certain facilities, including the following:     Patient/family informed of bed offers received:   Patient chooses bed at  Physician recommends and patient chooses bed at    Patient to be transferred to  on   Patient to be transferred to facility by   The following physician request were entered in Epic:   Additional Comments: 07/01/12: [facility] contacted and per Joni Reining they are willing to accept patient with LOG.

## 2012-06-30 NOTE — ED Notes (Signed)
Pt unable to have bx due to WBC count being too high; per DR [facility]

## 2012-07-01 ENCOUNTER — Inpatient Hospital Stay (HOSPITAL_COMMUNITY): Payer: Self-pay

## 2012-07-01 LAB — CBC WITH DIFFERENTIAL/PLATELET
Basophils Absolute: 0 10*3/uL (ref 0.0–0.1)
HCT: 20.8 % — ABNORMAL LOW (ref 39.0–52.0)
Hemoglobin: 7.5 g/dL — ABNORMAL LOW (ref 13.0–17.0)
Lymphocytes Relative: 12 % (ref 12–46)
Monocytes Absolute: 1.1 10*3/uL — ABNORMAL HIGH (ref 0.1–1.0)
Monocytes Relative: 7 % (ref 3–12)
Neutro Abs: 12.3 10*3/uL — ABNORMAL HIGH (ref 1.7–7.7)
RDW: 15.6 % — ABNORMAL HIGH (ref 11.5–15.5)
WBC: 15.5 10*3/uL — ABNORMAL HIGH (ref 4.0–10.5)

## 2012-07-01 LAB — RENAL FUNCTION PANEL
Albumin: 2.7 g/dL — ABNORMAL LOW (ref 3.5–5.2)
Chloride: 97 mEq/L (ref 96–112)
GFR calc non Af Amer: 12 mL/min — ABNORMAL LOW (ref >90)
Potassium: 4.4 mEq/L (ref 3.5–5.1)

## 2012-07-01 LAB — CBC
Platelets: 227 10*3/uL (ref 150–400)
RBC: 2.77 MIL/uL — ABNORMAL LOW (ref 4.22–5.81)
WBC: 15.2 10*3/uL — ABNORMAL HIGH (ref 4.0–10.5)

## 2012-07-01 NOTE — Progress Notes (Signed)
This visit was initiated by a nurse's request.  Pt was watching a movie and did not wish to be disturbed.  Please page me if assistance is needed. Trayton Szabo  [phone] oncall pager

## 2012-07-01 NOTE — Progress Notes (Signed)
Clinical Social Work Department CLINICAL SOCIAL WORK PSYCHIATRY SERVICE LINE ASSESSMENT 07/01/2012  Patient:  Manuel Lucas  Account:  [id number]  Admit Date:  06/23/2012  Clinical Social Worker:  Unk Lightning, LCSW  Date/Time:  07/01/2012 04:00 PM Referred by:  Physician  Date referred:  07/01/2012 Reason for Referral  Behavioral Health Issues   Presenting Symptoms/Problems (In the person's/family's own words):   "I just want to get out of here"    MD requesting consult for capacity    Per bedside RN, patient was paranoid this morning regarding treatment   Abuse/Neglect/Trauma History (check all that apply)  Denies history   Abuse/Neglect/Trauma Comments:   Psychiatric History (check all that apply)  Denies history   Psychiatric medications:  N/A   Current Mental Health Hospitalizations/Previous Mental Health History:   N/A   Current provider:   N/A   Place and Date:   N/A   Current Medications:   acetaminophen, acetaminophen, hydrALAZINE, morphine injection, ondansetron (ZOFRAN) IV, ondansetron, oxyCODONE, simethicone, sodium chloride                        . amLODipine  10 mg Oral Daily  . calcitRIOL  0.25 mcg Oral QODAY  . darbepoetin (ARANESP) injection - NON-DIALYSIS  60 mcg Subcutaneous Q Wed-1800  . pantoprazole  40 mg Oral BID AC  . sodium chloride  3 mL Intravenous Q12H  . sodium chloride  3 mL Intravenous Q12H  . DISCONTD: cefTRIAXone (ROCEPHIN)  IV  1 g Intravenous Q24H   Previous Impatient Admission/Date/Reason:   N/A   Emotional Health / Current Symptoms    Suicide/Self Harm  None reported   Suicide attempt in the past:   Other harmful behavior:   Psychotic/Dissociative Symptoms  Paranoia   Other Psychotic/Dissociative Symptoms:    Attention/Behavioral Symptoms  Within Normal Limits   Other Attention / Behavioral Symptoms:    Cognitive Impairment  Poor/Impaired Decision-Making   Other Cognitive Impairment:    Mood and Adjustment    Guarded    Stress, Anxiety, Trauma, Any Recent Loss/Stressor  None reported   Anxiety (frequency):   Phobia (specify):   Compulsive behavior (specify):   Obsessive behavior (specify):   Other:   Substance Abuse/Use  None   SBIRT completed (please refer for detailed history):  N  Self-reported substance use:   Patient denies any alcohol or drug use. Patient reports he does not even like to take OTC or prescribed medication   Urinary Drug Screen Completed:  N Alcohol level:    Environmental/Housing/Living Arrangement  Shelter   Who is in the home:   Patient was [facility] at [facility]   Emergency contact:  Manuel Lucas-Manuel Lucas    Patient's Strengths and Goals (patient's own words):   "I know what I am doing. I know my body better than anyone else. I just want to work and pay my bills"   Clinical Social Worker's Interpretive Summary:   CSW received referral and reviewed chart. CSW spoke with bedside RN who reported that patient has been paranoid regarding his treatment. CSW met with patient at bedside. No visitors were present.    CSW introduced myself and explained role. Patient reports that he came to the hospital because his leg was hurting and now they are trying to do multiple tests on him. Patient complained that doctors and RNs complete test and try to get him to take medication but do not explain why. CSW questioned if patient  felt that hospital had his best interest in mind or if they were trying to hurt him. Patient reports that he feels [facility] has his best interest in mind but has mistrust in the health care system. Patient feels that MDs prescribe medications that are not always needed in order to get more money. 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 Patient reports that he was encouraged to see a doctor to evaluate  but patient never went to MD. Patient struggles with trust and determining if he is actually sick. Patient appeared appropriate in worrying about the cost of hospital stay and medications since he is unemployed. Patient reported that he could not afford a larger bill and to pay for medications and is worried that he will be prescribed medication that is not necessary. Patient reports no mental health problems and reports no substance abuse.    CSW called Manuel Lucas who reported that she still has contact with patient. Manuel Lucas is currently out of town but has been speaking with patient via phone. Manuel Lucas is concerned about patient's confusion. Manuel Lucas reports that patient is unable to explain why he is in the hospital and one day reported that he was at [employer] when he was still at [facility]. Manuel Lucas reports this is abnormal behavior for patient. Manuel Lucas confirms no MH or SA issues.    Patient was appropriate during assessment and engaged. Patient was guarded at times but had valid concerns regarding health care and cost. Patient had appropriate eye contact. Patient was aware of surroundings and not confused during this assessment but RN and Manuel Lucas report confusion and paranoia at times.    CSW will continue to follow to assist with needs after psych MD assesses patient.   Disposition:  Recommend Psych CSW continuing to support while in hospital

## 2012-07-01 NOTE — Progress Notes (Signed)
Patient ID: Teandre Hamre, male   DOB: 05-27-1956, 56 y.o.   MRN: [id number]   Subjective: Patient continues to have poor understanding of his overall situation. He is primarily focused at this point on getting something to eat and drink. His percutaneous biopsy was canceled yesterday due to elevated white blood cell count. Clinically he does not have obvious active infection. Repeat ultrasound is pending. Creatinine continues to improve the BUN is quite high suggesting there may be some prerenal defects as well as this point. He does not seem to have new complaints at this time.  Objective: Vital signs in last 24 hours: Temp:  [97.9 F (36.6 C)-98.7 F (37.1 C)] 98.7 F (37.1 C) (08/01 0524) Pulse Rate:  [90-103] 90  (08/01 0524) Resp:  [18-20] 18  (08/01 0524) BP: (109-135)/(56-82) 122/77 mmHg (08/01 0524) SpO2:  [94 %-100 %] 96 % (08/01 0524) Weight:  [74.2 kg (163 lb 9.3 oz)] 74.2 kg (163 lb 9.3 oz) (07/31 2114)  Intake/Output from previous day: 07/31 0701 - 08/01 0700 In: 1600 [P.O.:600; I.V.:950; IV Piggyback:50] Out: 1725 [Urine:1725] Intake/Output this shift:    Physical Exam:  Constitutional: Vital signs reviewed. WD WN in NAD   Eyes: PERRL, No scleral icterus.   Cardiovascular: RRR Pulmonary/Chest: Normal effort Abdominal: Soft. Non-tender, non-distended, bowel sounds are normal, no masses, organomegaly, or guarding present.  Genitourinary: Foley catheter draining clear urine. Extremities: No cyanosis or edema   Lab Results:  Basename 07/01/12 0510 06/30/12 0610 06/29/12 0535  HGB 7.6* 8.6* 9.0*  HCT 21.5* 24.0* 25.2*   BMET  Basename 07/01/12 0510 06/30/12 0630  NA 135 135  K 4.4 4.4  CL 97 94*  CO2 24 23  GLUCOSE 107* 112*  BUN 71* 64*  CREATININE 4.84* 5.37*  CALCIUM 9.5 9.8    Basename 06/30/12 0610  LABPT --  INR 1.26   No results found for this basename: LABURIN:1 in the last 72 hours Results for orders placed during the hospital encounter of  06/23/12  URINE CULTURE     Status: Normal   Collection Time   06/29/12  3:28 PM      Component Value Range Status Comment   Specimen Description URINE, CATHETERIZED   Final    Special Requests NONE   Final    Culture  Setup Time 06/29/2012 15:48   Final    Colony Count NO GROWTH   Final    Culture NO GROWTH   Final    Report Status 06/30/2012 FINAL   Final     Studies/Results: Ct Pelvis Limited W/o Cm  06/30/2012  *RADIOLOGY REPORT*  Clinical Data: Enlarged iliac nodes  CT PELVIS LIMITED WITHOUT CONTRAST  Findings: The patient was scanned in preparation for biopsy.  The biopsy was postponed secondary to clinical factors. The series of axial images through the pelvis demonstrate the enlarged bilateral iliac masses have a Foley catheter decompressing the bladder.  IMPRESSION: Biopsy was postponed secondary to clinical factors.  The patient should be credited for this examination.  Original Report Authenticated By: Donavan Burnet, M.D.    Assessment/Plan:   Obstructive uropathy: Again this is primarily outlet obstruction from prostate cancer (presumptive diagnosis). It is also possible the patient could have a component of ureteral obstruction secondary to bladder wall thickening  and the pelvic adenopathy. Repeat ultrasound pending to see if hydronephrosis still persists or not. If there is evidence of ongoing hydronephrosis than percutaneous nephrostomy tube placement may be necessary. I think he would be okay  to proceed with the percutaneous biopsy despite the leukocytosis since he is otherwise clinically fine. I will see about potentially getting him on the schedule for a limited channel TURP next week at [facility]. Patient clearly needs social work consultation for ultimate placement and assistance in obtaining Medicaid coverage.  LOS: 8 days   Kierria Feigenbaum S 07/01/2012, 8:09 AM

## 2012-07-01 NOTE — Progress Notes (Signed)
Patient ID: Manuel Lucas  male  YNW:[id number]    DOB: Jul 19, 1956    DOA: 06/23/2012  PCP: Sheila Oats, MD  Subjective: No specific complaints except "I am starved, I want soup". The patient refusing to listen or understand why he was n.p.o. he feels paranoid and feels that "everyone is out to get him". He refused imagings this morning.    Objective: Weight change: 0 kg (0 lb)  Intake/Output Summary (Last 24 hours) at 07/01/12 1411 Last data filed at 07/01/12 1100  Gross per 24 hour  Intake 1598.33 ml  Output   2175 ml  Net -576.67 ml   Blood pressure 126/75, pulse 95, temperature 97.8 F (36.6 C), temperature source Oral, resp. rate 18, height 5\' 9"  (1.753 m), weight 74.2 kg (163 lb 9.3 oz), SpO2 92.00%.  Physical Exam: General: Alert and awake, not in any acute distress. HEENT: anicteric sclera, pupils reactive to light and accommodation, EOMI CVS: S1-S2 clear, no murmur rubs or gallops Chest: clear to auscultation bilaterally, no wheezing, rales or rhonchi Abdomen: soft nontender, nondistended, normal bowel sounds, no organomegaly Extremities: no cyanosis, clubbing or edema noted bilaterally   Lab Results: Basic Metabolic Panel:  Lab 07/01/12 6578 06/30/12 0630  NA 135 135  K 4.4 4.4  CL 97 94*  CO2 24 23  GLUCOSE 107* 112*  BUN 71* 64*  CREATININE 4.84* 5.37*  CALCIUM 9.5 9.8  MG -- --  PHOS 6.4* --   Liver Function Tests:  Lab 07/01/12 0510 06/30/12 0630 06/25/12 0600  AST -- -- 18  ALT -- -- 12  ALKPHOS -- -- 38*  BILITOT -- -- 0.3  PROT -- -- 7.2  ALBUMIN 2.7* 3.0* --   CBC:  Lab 07/01/12 0824 07/01/12 0510  WBC 15.5* 15.2*  NEUTROABS 12.3* --  HGB 7.5* 7.6*  HCT 20.8* 21.5*  MCV 77.3* 77.6*  PLT 220 227     Micro Results: Recent Results (from the past 240 hour(s))  URINE CULTURE     Status: Normal   Collection Time   06/29/12  3:28 PM      Component Value Range Status Comment   Specimen Description URINE, CATHETERIZED   Final    Special Requests NONE   Final    Culture  Setup Time 06/29/2012 15:48   Final    Colony Count NO GROWTH   Final    Culture NO GROWTH   Final    Report Status 06/30/2012 FINAL   Final   CULTURE, BLOOD (ROUTINE X 2)     Status: Normal (Preliminary result)   Collection Time   06/29/12  3:32 PM      Component Value Range Status Comment   Specimen Description BLOOD LEFT ARM   Final    Special Requests BOTTLES DRAWN AEROBIC ONLY 7CC   Final    Culture  Setup Time 06/30/2012 00:49   Final    Culture     Final    Value:        BLOOD CULTURE RECEIVED NO GROWTH TO DATE CULTURE WILL BE HELD FOR 5 DAYS BEFORE ISSUING A FINAL NEGATIVE REPORT   Report Status PENDING   Incomplete   CULTURE, BLOOD (ROUTINE X 2)     Status: Normal (Preliminary result)   Collection Time   06/29/12  3:35 PM      Component Value Range Status Comment   Specimen Description BLOOD LEFT ARM   Final    Special Requests BOTTLES DRAWN AEROBIC ONLY 5CC  Final    Culture  Setup Time 06/30/2012 00:48   Final    Culture     Final    Value:        BLOOD CULTURE RECEIVED NO GROWTH TO DATE CULTURE WILL BE HELD FOR 5 DAYS BEFORE ISSUING A FINAL NEGATIVE REPORT   Report Status PENDING   Incomplete     Studies/Results: Dg Chest 2 View  06/23/2012  *RADIOLOGY REPORT*  Clinical Data: Acute renal failure  CHEST - 2 VIEW  Comparison: None.  Findings: Normal heart size.  Lungs are clear.  No pneumothorax and no pleural effusion.  IMPRESSION: No active cardiopulmonary disease.  Original Report Authenticated By: Donavan Burnet, M.D.   Ct Pelvis Wo Contrast  06/28/2012  *  IMPRESSION: 1.  The prostate gland does not appear to be enlarged and is relatively unremarkable in appearance on this noncontrast CT examination. This does not exclude the presence of prostate cancer. 2.  However, there is a profound lymphadenopathy in the pelvis, most notably in the external iliac chains bilaterally, measuring up to 4.4 x 7.3 cm on the right and 5.4 x 4.3 cm  on the left. Clinical correlation for signs and symptoms of lymphoproliferative disorder is recommended. 3.  These lymph nodes exert mass effect upon adjacent vascular structures, particularly on the right, and there is extensive stranding around the right external iliac artery and vein.  This could suggest the presence of deep venous thrombosis.  Clinical correlation for signs and symptoms of right lower extremity edema is recommended.  If there is clinical concern for right lower extremity deep venous thrombosis, further evaluation with Doppler ultrasound may be warranted. 4.  A Foley balloon catheter is present within the lumen of the urinary bladder.  The urinary bladder appears nearly completely decompressed, but demonstrate some potential wall thickening and there are inflammatory changes surrounding the urinary bladder in the perivesical fat.  This could be secondary to inflammation or infiltration from neoplasm.  Clinical correlation is recommended.  These results were called by telephone on 06/28/2012 at 04:50 p.m. to Dr. Isabel Caprice, who verbally acknowledged these results. Subsequently, findings were also discussed with Dr. Cena Benton by Dr. Llana Aliment on 06/28/2012 of 05:05 p.m.  Original Report Authenticated By: Florencia Reasons, M.D.   Nm Bone Scan Whole Body  06/27/2012  *RADIOLOGY REPORT*  Clinical Data: Elevated PSA.  NUCLEAR MEDICINE WHOLE BODY BONE SCINTIGRAPHY  Technique:  Whole body anterior and posterior images were obtained approximately 3 hours after intravenous injection of radiopharmaceutical.  Radiopharmaceutical: CURIE TC-MDP TECHNETIUM TC 74M MEDRONATE IV KIT  Comparison: ultrasound 06/24/2012.  Findings: There are mild degenerative changes but no findings suspicious for osseous metastatic disease.  Mild bilateral hydronephrosis is suspected.  IMPRESSION: Negative bone scan for osseous metastatic disease.  Original Report Authenticated By: P. Loralie Champagne, M.D.   US  Renal  06/24/2012  *RADIOLOGY REPORT*  Clinical Data: Renal failure.  RENAL/URINARY TRACT ULTRASOUND COMPLETE  Comparison:  None.  Findings:  Right Kidney:  The right kidney measures 10.6 cm in length with slightly increased echotexture.  There is mild/moderate right hydronephrosis.  Left Kidney:  The left kidney measures 11.3 cm in length.  There is mild/moderate left hydronephrosis.  Bladder:  The urinary bladder is filled with urine.  There are bilateral ureteral jets.  IMPRESSION: Mild/moderate bilateral hydronephrosis.  The urinary bladder is distended and there are bilateral ureteral jets.  Findings raise concern for urinary bladder retention and/or bladder outlet obstruction.  Original Report  Authenticated By: Richarda Overlie, M.D.    Medications: Scheduled Meds:    . amLODipine  10 mg Oral Daily  . calcitRIOL  0.25 mcg Oral QODAY  . darbepoetin (ARANESP) injection - NON-DIALYSIS  60 mcg Subcutaneous Q Wed-1800  . pantoprazole  40 mg Oral BID AC  . sodium chloride  3 mL Intravenous Q12H  . sodium chloride  3 mL Intravenous Q12H  . DISCONTD: cefTRIAXone (ROCEPHIN)  IV  1 g Intravenous Q24H   Continuous Infusions:    . sodium chloride 50 mL/hr at 06/30/12 1900     Assessment/Plan: Principal Problem:  *Obstructive uropathy with acute renal failure - Renal ultrasound with mild/moderate bilateral hydronephrosis, renal and urology following, continue Foley catheter - CT of the pelvis recently showed enlarged lymph nodes at concern for either metastatic prostate CA versus lymphoma - Unknown baseline creatinine,continue IV fluids, placed on regular diet - CT guided biopsy  of the right iliac lymph node still pending. Patient was sent back from IR due to leukocytosis, however does not appear to have any active infection, appreciate ID recommendations. Repeat ultrasound was ordered for the hydronephrosis however patient refused to go for the procedure.  Active Problems:  Anemia: Patient  started on Aranesp weekly while inpatient. Hemoglobin continues to trend down, will need packed RBC transfusion if less than 7.5.   Elevated blood pressure/hypertension - Continue amlodipine, PRN hydralazine  Lymphadenopathy: See #1, CT guided biopsy  pending  Leukocytosis: Slightly improved today -Likely leukemoid reaction, appreciate ID recommendations.  Patient's paranoid behavior: Called for psychiatry consultation today for capacity as patient continues to exhibit paranoid behavior with refusing the imagings and procedures, poor insight into his situation.  DVT Prophylaxis: SCDs  Code Status: Full Disposition: Unclear, due to patient's homeless situation and will need followup care with urology, biopsy results and further management.Psychiatry consultation pending.   LOS: 8 days   Haja Crego M.D. [facility] 07/01/2012, 2:11 PM Pager: [phone]  If 7PM-7AM, please contact night-coverage [email or web address] Password [email or web address]

## 2012-07-01 NOTE — Progress Notes (Signed)
Subjective:  Biopsy not done by IR d/t elevated WBC ID has seen - note reviewed - they rec going ahead with node biopsy With IVF and foley drainage renal function continues to slowly improve   Objective Vital signs in last 24 hours: Filed Vitals:   06/30/12 0927 06/30/12 1800 06/30/12 2114 07/01/12 0524  BP: 117/77 109/56 135/82 122/77  Pulse: 103 100 91 90  Temp: 98.5 F (36.9 C) 98.2 F (36.8 C) 97.9 F (36.6 C) 98.7 F (37.1 C)  TempSrc: Oral Oral Oral Oral  Resp: 18 20 18 18   Height:   5\' 9"  (1.753 m)   Weight:   74.2 kg (163 lb 9.3 oz)   SpO2: 94% 97% 100% 96%   Weight change: 0 kg (0 lb)  Intake/Output Summary (Last 24 hours) at 07/01/12 0747 Last data filed at 07/01/12 0524  Gross per 24 hour  Intake   1600 ml  Output   1725 ml  Net   -125 ml   Physical Exam:  Blood pressure 122/77, pulse 90, temperature 98.7 F (37.1 C), temperature source Oral, resp. rate 18, height 5\' 9"  (1.753 m), weight 74.2 kg (163 lb 9.3 oz), SpO2 96.00%. Still looks dry on exam Clear lungs No edema Foley in place and draining  Labs: Basic Metabolic Panel:  Lab 07/01/12 5621 06/30/12 0630 06/29/12 0535 06/28/12 0600 06/27/12 0600 06/26/12 1124 06/25/12 0600  NA 135 135 135 135 137 139 139  K 4.4 4.4 4.5 5.5* 5.2* 5.4* 4.9  CL 97 94* 96 98 100 102 104  CO2 24 23 23 25 22 24 23   GLUCOSE 107* 112* 121* 120* 120* 127* 97  BUN 71* 64* 53* 51* 50* 53* 57*  CREATININE 4.84* 5.37* 5.20* 5.35* 5.53* 5.97* 6.87*  ALB -- -- -- -- -- -- --  CALCIUM 9.5 9.8 9.6 9.6 9.6 9.1 8.8  PHOS 6.4* 6.8* 6.7* 5.1* 5.9* -- --    CBC:  Lab 07/01/12 0510 06/30/12 0610 06/29/12 0535 06/27/12 0600  WBC 15.2* 17.6* 18.4* 9.8  NEUTROABS -- -- -- --  HGB 7.6* 8.6* 9.0* 9.3*  HCT 21.5* 24.0* 25.2* 26.1*  MCV 77.6* 77.9* 77.8* 77.9*  PLT 227 195 201 196   Lab 06/24/12 0800  CKTOTAL 179  CKMB 3.1  CKMBINDEX --  TROPONINI <0.30  Iron Studies:  Lab 06/24/12 0757  IRON 65  TIBC 203*  TRANSFERRIN --    FERRITIN 617*   Studies/Results: Ct Pelvis Limited W/o Cm  06/30/2012  *RADIOLOGY REPORT*  Clinical Data: Enlarged iliac nodes  CT PELVIS LIMITED WITHOUT CONTRAST  Findings: The patient was scanned in preparation for biopsy.  The biopsy was postponed secondary to clinical factors. The series of axial images through the pelvis demonstrate the enlarged bilateral iliac masses have a Foley catheter decompressing the bladder.  IMPRESSION: Biopsy was postponed secondary to clinical factors.  The patient should be credited for this examination.  Original Report Authenticated By: Donavan Burnet, M.D.   Medications:   . sodium chloride 50 mL/hr at 06/30/12 1900   . amLODipine  10 mg Oral Daily  . calcitRIOL  0.25 mcg Oral QODAY  . cefTRIAXone (ROCEPHIN)  IV  1 g Intravenous Q24H  . darbepoetin (ARANESP) injection - NON-DIALYSIS  60 mcg Subcutaneous Q Wed-1800  . pantoprazole  40 mg Oral BID AC  . sodium chloride  3 mL Intravenous Q12H  . sodium chloride  3 mL Intravenous Q12H  . DISCONTD: ciprofloxacin  400 mg Intravenous Q24H  I  have reviewed scheduled and prn medications.  ASSESSMENT/RECS   1. Renal failure - unknown baseline creatinine.  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  With foley drainage there is some improvement in renal function, but how much recovery he will get is totally unknown.  Kidneys are somewhat echodense, UA is bland.  May have some underlying CKD independent of the obstructive process. I have not pursued further discussion with him about vascular access for dialysis - since he is showing continued slow improvement in renal function. 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 Anemia - Tsat OK. Startedt Aranesp 60/week (will likely not be able to get as outpt) (7/31)   4. Prostate cancer - Dr. Ellin Goodie notes reviewed.  CT reviewed. Await further input from urology.  Lymph node  biopsy needed .   5. Leukocytosis - urine culture pending. Cipro (7/30) May be leukemoid reaction per ID  6. Dispo - ? Where he will go - says he is homeless.  I see that CSW now involved  Camille Bal, MD [facility] [phone] pager 07/01/2012, 7:47 AM

## 2012-07-01 NOTE — Progress Notes (Signed)
Subjective: Pt without acute changes; rt iliac lymph node biopsy has been reordered by primary team;pt seen by ID and elevated WBC felt to be c/w leukemoid rxn; cx's neg to date of blood/urine; repeat renal US with unchanged bilat mild-mod hydronephrosis Objective: Vital signs in last 24 hours: Temp:  [97.8 F (36.6 C)-98.7 F (37.1 C)] 97.8 F (36.6 C) (08/01 1000) Pulse Rate:  [90-100] 95  (08/01 1000) Resp:  [18-20] 18  (08/01 1000) BP: (109-135)/(56-82) 126/75 mmHg (08/01 1000) SpO2:  [92 %-100 %] 92 % (08/01 1000) Weight:  [163 lb 9.3 oz (74.2 kg)] 163 lb 9.3 oz (74.2 kg) (07/31 2114) Last BM Date: 06/28/12  Intake/Output from previous day: 07/31 0701 - 08/01 0700 In: 1600 [P.O.:600; I.V.:950; IV Piggyback:50] Out: 1725 [Urine:1725] Intake/Output this shift: Total I/O In: 120 [P.O.:120] Out: 900 [Urine:900] Chest- CTA bilat, heart- RRR, abd -few BS, tender pelvic region  Lab Results:   [facility] 07/01/12 0824 07/01/12 0510  WBC 15.5* 15.2*  HGB 7.5* 7.6*  HCT 20.8* 21.5*  PLT 220 227   BMET  Basename 07/01/12 0510 06/30/12 0630  NA 135 135  K 4.4 4.4  CL 97 94*  CO2 24 23  GLUCOSE 107* 112*  BUN 71* 64*  CREATININE 4.84* 5.37*  CALCIUM 9.5 9.8   PT/INR  Basename 06/30/12 0610  LABPROT 16.1*  INR 1.26   ABG No results found for this basename: PHART:2,PCO2:2,PO2:2,HCO3:2 in the last 72 hours  Studies/Results: US Renal  07/01/2012  *RADIOLOGY REPORT*  Clinical Data: Follow up hydronephrosis.  Status post Foley catheter insertion.  RENAL/URINARY TRACT ULTRASOUND COMPLETE  Comparison:  06/24/2012.  Findings:  Right Kidney:  Mild to moderate hydronephrosis.  11 cm long axis. Parenchymal echotexture is within normal limits.  Left Kidney:  Mild to moderate hydronephrosis.  10.9 cm long axis. The parenchymal echotexture is within normal limits.  Bladder:  Decompressed with mural thickening.  Foley catheter present.  IMPRESSION: Unchanged mild to moderate  bilateral symmetric hydronephrosis, essentially unchanged from recent ultrasound 06/24/2012.  In conjunction with prior CT, this likely represents compression of the distal ureters in the anatomic pelvis.  No renal sinus or collecting system obstructing lesion is identified.  Original Report Authenticated By: Andreas Newport, M.D.   Ct Pelvis Limited W/o Cm  06/30/2012  *RADIOLOGY REPORT*  Clinical Data: Enlarged iliac nodes  CT PELVIS LIMITED WITHOUT CONTRAST  Findings: The patient was scanned in preparation for biopsy.  The biopsy was postponed secondary to clinical factors. The series of axial images through the pelvis demonstrate the enlarged bilateral iliac masses have a Foley catheter decompressing the bladder.  IMPRESSION: Biopsy was postponed secondary to clinical factors.  The patient should be credited for this examination.  Original Report Authenticated By: Donavan Burnet, M.D.    Anti-infectives: Anti-infectives     Start     Dose/Rate Route Frequency Ordered Stop   06/30/12 1400   cefTRIAXone (ROCEPHIN) 1 g in dextrose 5 % 50 mL IVPB  Status:  Discontinued        1 g 100 mL/hr over 30 Minutes Intravenous Every 24 hours 06/30/12 1345 07/01/12 0951   06/29/12 2000   ciprofloxacin (CIPRO) IVPB 400 mg  Status:  Discontinued        400 mg 200 mL/hr over 60 Minutes Intravenous Every 24 hours 06/29/12 1904 06/30/12 1344          Assessment/Plan: Pt tent scheduled for CT guided biopsy of enlarged rt iliac lymph node on 8/2;  plan d/w pt again and he has agreed to undergo biopsy.   LOS: 8 days    Javin Nong,D [facility] 07/01/2012

## 2012-07-01 NOTE — Progress Notes (Signed)
INFECTIOUS DISEASE PROGRESS NOTE  ID: Manuel Lucas is a 56 y.o. male with leg edema and concern for infection with increased WBC.  Cultures negative.  Subjective: No complaints.  No fever, no chills.    Abtx:  Anti-infectives     Start     Dose/Rate Route Frequency Ordered Stop   06/30/12 1400   cefTRIAXone (ROCEPHIN) 1 g in dextrose 5 % 50 mL IVPB  Status:  Discontinued        1 g 100 mL/hr over 30 Minutes Intravenous Every 24 hours 06/30/12 1345 07/01/12 0951   06/29/12 2000   ciprofloxacin (CIPRO) IVPB 400 mg  Status:  Discontinued        400 mg 200 mL/hr over 60 Minutes Intravenous Every 24 hours 06/29/12 1904 06/30/12 1344          Medications: I have reviewed the patient's current medications.  Objective: Vital signs in last 24 hours: Temp:  [97.9 F (36.6 C)-98.7 F (37.1 C)] 98.7 F (37.1 C) (08/01 0524) Pulse Rate:  [90-100] 90  (08/01 0524) Resp:  [18-20] 18  (08/01 0524) BP: (109-135)/(56-82) 122/77 mmHg (08/01 0524) SpO2:  [96 %-100 %] 96 % (08/01 0524) Weight:  [163 lb 9.3 oz (74.2 kg)] 163 lb 9.3 oz (74.2 kg) (07/31 2114)   General appearance: alert, cooperative and no distress Resp: clear to auscultation bilaterally Cardio: regular rate and rhythm, S1, S2 normal, no murmur, click, rub or gallop  Lab Results  Basename 07/01/12 0824 07/01/12 0510 06/30/12 0630  WBC 15.5* 15.2* --  HGB 7.5* 7.6* --  HCT 20.8* 21.5* --  NA -- 135 135  K -- 4.4 4.4  CL -- 97 94*  CO2 -- 24 23  BUN -- 71* 64*  CREATININE -- 4.84* 5.37*  GLU -- -- --   Liver Panel  Basename 07/01/12 0510 06/30/12 0630  PROT -- --  ALBUMIN 2.7* 3.0*  AST -- --  ALT -- --  ALKPHOS -- --  BILITOT -- --  BILIDIR -- --  IBILI -- --   Sedimentation Rate No results found for this basename: ESRSEDRATE in the last 72 hours C-Reactive Protein No results found for this basename: CRP:2 in the last 72 hours  Microbiology: Recent Results (from the past 240 hour(s))  URINE CULTURE      Status: Normal   Collection Time   06/29/12  3:28 PM      Component Value Range Status Comment   Specimen Description URINE, CATHETERIZED   Final    Special Requests NONE   Final    Culture  Setup Time 06/29/2012 15:48   Final    Colony Count NO GROWTH   Final    Culture NO GROWTH   Final    Report Status 06/30/2012 FINAL   Final   CULTURE, BLOOD (ROUTINE X 2)     Status: Normal (Preliminary result)   Collection Time   06/29/12  3:32 PM      Component Value Range Status Comment   Specimen Description BLOOD LEFT ARM   Final    Special Requests BOTTLES DRAWN AEROBIC ONLY [facility]   Final    Culture  Setup Time 06/30/2012 00:49   Final    Culture     Final    Value:        BLOOD CULTURE RECEIVED NO GROWTH TO DATE CULTURE WILL BE HELD FOR 5 DAYS BEFORE ISSUING A FINAL NEGATIVE REPORT   Report Status PENDING   Incomplete  CULTURE, BLOOD (ROUTINE X 2)     Status: Normal (Preliminary result)   Collection Time   06/29/12  3:35 PM      Component Value Range Status Comment   Specimen Description BLOOD LEFT ARM   Final    Special Requests BOTTLES DRAWN AEROBIC ONLY 5CC   Final    Culture  Setup Time 06/30/2012 00:48   Final    Culture     Final    Value:        BLOOD CULTURE RECEIVED NO GROWTH TO DATE CULTURE WILL BE HELD FOR 5 DAYS BEFORE ISSUING A FINAL NEGATIVE REPORT   Report Status PENDING   Incomplete     Studies/Results: Ct Pelvis Limited W/o Cm  06/30/2012  *RADIOLOGY REPORT*  Clinical Data: Enlarged iliac nodes  CT PELVIS LIMITED WITHOUT CONTRAST  Findings: The patient was scanned in preparation for biopsy.  The biopsy was postponed secondary to clinical factors. The series of axial images through the pelvis demonstrate the enlarged bilateral iliac masses have a Foley catheter decompressing the bladder.  IMPRESSION: Biopsy was postponed secondary to clinical factors.  The patient should be credited for this examination.  Original Report Authenticated By: Donavan Burnet, M.D.      Assessment/Plan: 1) leukocytosis - no signs or symptoms of active infection.  Cultures negative.  I will d/c antibiotics.  Likely leukemoid reaction.    Manuel Lucas Infectious Diseases 07/01/2012, 9:51 AM

## 2012-07-01 NOTE — Progress Notes (Signed)
Utilization review completed.  

## 2012-07-01 NOTE — Progress Notes (Signed)
Physical Therapy Treatment Patient Details Name: Mozes Sagar MRN: [id number] DOB: 09/18/1956 Today's Date: 07/01/2012 Time: 4098-1191 PT Time Calculation (min): 26 min  PT Assessment / Plan / Recommendation Comments on Treatment Session  Pt admitted with renal dysfunction and progressing with mobility. Pt requires education prior to mobility as to the benefit to pt and long term need of maintaining strength and function. Pt difficult to understand at times in regard to his thought process. Pt denied further ambulation or HEP but agreeable to being OOB in chair end of session.     Follow Up Recommendations       Barriers to Discharge        Equipment Recommendations       Recommendations for Other Services    Frequency     Plan Discharge plan remains appropriate;Frequency remains appropriate    Precautions / Restrictions Precautions Precautions: Fall   Pertinent Vitals/Pain Burning at penis, RN aware    Mobility  Bed Mobility Bed Mobility: Supine to Sit;Sitting - Scoot to Edge of Bed Supine to Sit: 5: Supervision Sitting - Scoot to Edge of Bed: 6: Modified independent (Device/Increase time) Details for Bed Mobility Assistance: cueing for sequence Transfers Transfers: Sit to Stand;Stand to Sit Sit to Stand: 5: Supervision;From bed Stand to Sit: 5: Supervision;To bed;To chair/3-in-1 Details for Transfer Assistance: cueing for hand placement for safety Ambulation/Gait Ambulation/Gait Assistance: 4: Min guard Ambulation Distance (Feet): 60 Feet Assistive device: Rolling walker Ambulation/Gait Assistance Details: cueing for position in RW and RW height adjusted to pt needs Gait Pattern: Step-through pattern;Decreased stride length Stairs: No    Exercises     PT Diagnosis:    PT Problem List:   PT Treatment Interventions:     PT Goals Acute Rehab PT Goals PT Goal: Supine/Side to Sit - Progress: Progressing toward goal PT Goal: Sit to Supine/Side - Progress: Progressing  toward goal PT Goal: Sit to Stand - Progress: Progressing toward goal PT Goal: Stand to Sit - Progress: Progressing toward goal PT Goal: Ambulate - Progress: Progressing toward goal  Visit Information  Last PT Received On: 07/01/12 Assistance Needed: +1    Subjective Data  Subjective: "see this thing in me is burning"- catheter   Cognition  Overall Cognitive Status: Impaired Area of Impairment: Problem solving Arousal/Alertness: Awake/alert Orientation Level: Appears intact for tasks assessed Behavior During Session: [facility] for tasks performed Problem Solving: pt needing redirection at times difficult to get pt to understand the importance of mobility and catheter positioning initially    Balance     End of Session PT - End of Session Activity Tolerance: Patient tolerated treatment well Patient left: in chair;with call bell/phone within reach Nurse Communication: Mobility status   GP     Delorse Lek 07/01/2012, 4:31 PM Delaney Meigs, PT [phone]

## 2012-07-02 ENCOUNTER — Inpatient Hospital Stay (HOSPITAL_COMMUNITY): Payer: Self-pay

## 2012-07-02 DIAGNOSIS — F22 Delusional disorders: Secondary | ICD-10-CM

## 2012-07-02 LAB — CBC
Hemoglobin: 7.4 g/dL — ABNORMAL LOW (ref 13.0–17.0)
MCH: 27.6 pg (ref 26.0–34.0)
RBC: 2.68 MIL/uL — ABNORMAL LOW (ref 4.22–5.81)
WBC: 10.3 10*3/uL (ref 4.0–10.5)

## 2012-07-02 LAB — RENAL FUNCTION PANEL
CO2: 22 mEq/L (ref 19–32)
Chloride: 102 mEq/L (ref 96–112)
Creatinine, Ser: 3.41 mg/dL — ABNORMAL HIGH (ref 0.50–1.35)
GFR calc non Af Amer: 18 mL/min — ABNORMAL LOW (ref >90)
Potassium: 4.1 mEq/L (ref 3.5–5.1)

## 2012-07-02 MED ORDER — FENTANYL CITRATE 0.05 MG/ML IJ SOLN
INTRAMUSCULAR | Status: AC
  Filled 2012-07-02: qty 4

## 2012-07-02 MED ORDER — SODIUM CHLORIDE 0.9 % IV SOLN
INTRAVENOUS | Status: AC | PRN
  Administered 2012-07-02: 50 mL/h via INTRAVENOUS

## 2012-07-02 MED ORDER — MIDAZOLAM HCL 5 MG/5ML IJ SOLN
INTRAMUSCULAR | Status: AC | PRN
  Administered 2012-07-02 (×2): 1 mg via INTRAVENOUS

## 2012-07-02 MED ORDER — MIDAZOLAM HCL 2 MG/2ML IJ SOLN
INTRAMUSCULAR | Status: AC
  Filled 2012-07-02: qty 4

## 2012-07-02 MED ORDER — FENTANYL CITRATE 0.05 MG/ML IJ SOLN
INTRAMUSCULAR | Status: AC | PRN
  Administered 2012-07-02: 50 ug via INTRAVENOUS
  Administered 2012-07-02: 25 ug via INTRAVENOUS

## 2012-07-02 NOTE — Procedures (Signed)
Successful CT fluoro LT pelvic adenopathy 18g core bx No comp Stable Full report in pacs Path pending

## 2012-07-02 NOTE — Progress Notes (Signed)
Progress note created after learning there was much more to pt's issue than just determining capacity.  Discussed with Dr. Isidoro Donning; RN and Psych CSW Dr. Isidoro Donning says he has a very abnormal PSA Acute renal failure aand exhibited paranoia agitation without the demonstration of trying to understand what att the tests were about.  He readily engaged in conversation and the MSE.  He has slow to calculate serial 7s and did note.  He completed four calculations with serial 3s He demonstrated abstract reasoning.  He knew the presidents Obama and Bush.  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  He has a full range of affect.  He alludes to the paranoid thoughts by saying the doctors and nurses were coming in and contradicting themselves.  He demonstrates he lack understanding of the medical team and that each specialty may , have their own select studies.  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  He just lost his job and now cannot pay rent for a room at [employer]. RECOMMENDATION:  1, /will complete consultation and evaluate pt's degree of paranoia and ability to understand his medical condidtions.  Rebie Peale J. Ferol Luz, MD Psychiatrist  07/02/2012 2:00 AM    Pt is seen ~ 2;30pm 07/01/12

## 2012-07-02 NOTE — Progress Notes (Signed)
Patient ID: Manuel Lucas  male  BJY:[id number]    DOB: Aug 18, 1956    DOA: 06/23/2012  PCP: Sheila Oats, MD  Subjective: Patient in much better spirits today, alert and talkative, much more responsive to explaining the imagings, his medical situation and management today.    Objective: Weight change: 0 kg (0 lb)  Intake/Output Summary (Last 24 hours) at 07/02/12 1328 Last data filed at 07/02/12 1100  Gross per 24 hour  Intake   1080 ml  Output   3300 ml  Net  -2220 ml   Blood pressure 117/75, pulse 88, temperature 97.9 F (36.6 C), temperature source Oral, resp. rate 20, height 5\' 9"  (1.753 m), weight 74.2 kg (163 lb 9.3 oz), SpO2 100.00%.  Physical Exam: General: Alert and awake, not in any acute distress. HEENT: anicteric sclera, pupils reactive to light and accommodation, EOMI CVS: S1-S2 clear, no murmur rubs or gallops Chest: clear to auscultation bilaterally, no wheezing, rales or rhonchi Abdomen: soft nontender, nondistended, normal bowel sounds, no organomegaly Extremities: no cyanosis, clubbing or edema noted bilaterally   Lab Results: Basic Metabolic Panel:  Lab 07/02/12 0865 07/01/12 0510  NA 138 135  K 4.1 4.4  CL 102 97  CO2 22 24  GLUCOSE 106* 107*  BUN 57* 71*  CREATININE 3.41* 4.84*  CALCIUM 9.3 9.5  MG -- --  PHOS 3.6 --   Liver Function Tests:  Lab 07/02/12 0550 07/01/12 0510  AST -- --  ALT -- --  ALKPHOS -- --  BILITOT -- --  PROT -- --  ALBUMIN 2.7* 2.7*   CBC:  Lab 07/02/12 0550 07/01/12 0824  WBC 10.3 15.5*  NEUTROABS -- 12.3*  HGB 7.4* 7.5*  HCT 21.2* 20.8*  MCV 79.1 77.3*  PLT 236 220     Micro Results: Recent Results (from the past 240 hour(s))  URINE CULTURE     Status: Normal   Collection Time   06/29/12  3:28 PM      Component Value Range Status Comment   Specimen Description URINE, CATHETERIZED   Final    Special Requests NONE   Final    Culture  Setup Time 06/29/2012 15:48   Final    Colony Count NO GROWTH    Final    Culture NO GROWTH   Final    Report Status 06/30/2012 FINAL   Final   CULTURE, BLOOD (ROUTINE X 2)     Status: Normal (Preliminary result)   Collection Time   06/29/12  3:32 PM      Component Value Range Status Comment   Specimen Description BLOOD LEFT ARM   Final    Special Requests BOTTLES DRAWN AEROBIC ONLY 7CC   Final    Culture  Setup Time 06/30/2012 00:49   Final    Culture     Final    Value:        BLOOD CULTURE RECEIVED NO GROWTH TO DATE CULTURE WILL BE HELD FOR 5 DAYS BEFORE ISSUING A FINAL NEGATIVE REPORT   Report Status PENDING   Incomplete   CULTURE, BLOOD (ROUTINE X 2)     Status: Normal (Preliminary result)   Collection Time   06/29/12  3:35 PM      Component Value Range Status Comment   Specimen Description BLOOD LEFT ARM   Final    Special Requests BOTTLES DRAWN AEROBIC ONLY 5CC   Final    Culture  Setup Time 06/30/2012 00:48   Final    Culture  Final    Value:        BLOOD CULTURE RECEIVED NO GROWTH TO DATE CULTURE WILL BE HELD FOR 5 DAYS BEFORE ISSUING A FINAL NEGATIVE REPORT   Report Status PENDING   Incomplete     Studies/Results: Dg Chest 2 View  06/23/2012  *RADIOLOGY REPORT*  Clinical Data: Acute renal failure  CHEST - 2 VIEW  Comparison: None.  Findings: Normal heart size.  Lungs are clear.  No pneumothorax and no pleural effusion.  IMPRESSION: No active cardiopulmonary disease.  Original Report Authenticated By: Donavan Burnet, M.D.   Ct Pelvis Wo Contrast  06/28/2012  *  IMPRESSION: 1.  The prostate gland does not appear to be enlarged and is relatively unremarkable in appearance on this noncontrast CT examination. This does not exclude the presence of prostate cancer. 2.  However, there is a profound lymphadenopathy in the pelvis, most notably in the external iliac chains bilaterally, measuring up to 4.4 x 7.3 cm on the right and 5.4 x 4.3 cm on the left. Clinical correlation for signs and symptoms of lymphoproliferative disorder is recommended. 3.   These lymph nodes exert mass effect upon adjacent vascular structures, particularly on the right, and there is extensive stranding around the right external iliac artery and vein.  This could suggest the presence of deep venous thrombosis.  Clinical correlation for signs and symptoms of right lower extremity edema is recommended.  If there is clinical concern for right lower extremity deep venous thrombosis, further evaluation with Doppler ultrasound may be warranted. 4.  A Foley balloon catheter is present within the lumen of the urinary bladder.  The urinary bladder appears nearly completely decompressed, but demonstrate some potential wall thickening and there are inflammatory changes surrounding the urinary bladder in the perivesical fat.  This could be secondary to inflammation or infiltration from neoplasm.  Clinical correlation is recommended.  These results were called by telephone on 06/28/2012 at 04:50 p.m. to Dr. Isabel Caprice, who verbally acknowledged these results. Subsequently, findings were also discussed with Dr. Cena Benton by Dr. Llana Aliment on 06/28/2012 of 05:05 p.m.  Original Report Authenticated By: Florencia Reasons, M.D.   Nm Bone Scan Whole Body  06/27/2012  *RADIOLOGY REPORT*  Clinical Data: Elevated PSA.  NUCLEAR MEDICINE WHOLE BODY BONE SCINTIGRAPHY  Technique:  Whole body anterior and posterior images were obtained approximately 3 hours after intravenous injection of radiopharmaceutical.  Radiopharmaceutical: CURIE TC-MDP TECHNETIUM TC 74M MEDRONATE IV KIT  Comparison: ultrasound 06/24/2012.  Findings: There are mild degenerative changes but no findings suspicious for osseous metastatic disease.  Mild bilateral hydronephrosis is suspected.  IMPRESSION: Negative bone scan for osseous metastatic disease.  Original Report Authenticated By: P. Loralie Champagne, M.D.   US Renal  06/24/2012  *RADIOLOGY REPORT*  Clinical Data: Renal failure.  RENAL/URINARY TRACT ULTRASOUND COMPLETE  Comparison:   None.  Findings:  Right Kidney:  The right kidney measures 10.6 cm in length with slightly increased echotexture.  There is mild/moderate right hydronephrosis.  Left Kidney:  The left kidney measures 11.3 cm in length.  There is mild/moderate left hydronephrosis.  Bladder:  The urinary bladder is filled with urine.  There are bilateral ureteral jets.  IMPRESSION: Mild/moderate bilateral hydronephrosis.  The urinary bladder is distended and there are bilateral ureteral jets.  Findings raise concern for urinary bladder retention and/or bladder outlet obstruction.  Original Report Authenticated By: Richarda Overlie, M.D.    Medications: Scheduled Meds:    . amLODipine  10 mg Oral Daily  .  calcitRIOL  0.25 mcg Oral QODAY  . darbepoetin (ARANESP) injection - NON-DIALYSIS  60 mcg Subcutaneous Q Wed-1800  . fentaNYL      . midazolam      . pantoprazole  40 mg Oral BID AC  . sodium chloride  3 mL Intravenous Q12H  . sodium chloride  3 mL Intravenous Q12H   Continuous Infusions:    . sodium chloride 50 mL/hr (07/02/12 0826)     Assessment/Plan: Principal Problem:  *Obstructive uropathy with acute renal failure - Repeat Renal ultrasound yesterday showed no significant change in bilateral hydronephrosis despite the Foley catheter, renal and urology following, continue Foley catheter - CT of the pelvis recently showed enlarged lymph nodes at concern for either metastatic prostate CA versus lymphoma - Unknown baseline creatinine,continue IV fluids, placed on regular diet - CT guided biopsy  of the right iliac lymph node today, will await further recommendations from urology regarding the unchanged bilateral hydronephrosis, if perc nephrostomy tube is needed - Creatinine function is improving with hydration, appreciate renal recommendations.  Active Problems:  Anemia: Patient started on Aranesp weekly while inpatient. Hemoglobin continues to trend down - Discussed with patient regarding packed RBC  transfusion, at this time he refused and wants to think about it.    Elevated blood pressure/hypertension - Continue amlodipine, PRN hydralazine  Lymphadenopathy: See #1, CT guided biopsy  today   Leukocytosis: Slightly improved today -Likely leukemoid reaction, appreciate ID recommendations.  Patient's paranoid behavior: Appreciate psychiatry recommendations, the patient was much more responsive to the discussion regarding his medical situation, need for imagings/procedures and understanding the management today. I believe, being n.p.o. status and wanting regular diet, having no control over his medical situation placed him in the somewhat acute paranoid behavior.  DVT Prophylaxis: SCDs  Code Status: Full Disposition: Unclear, due to patient's homeless situation and will need followup care with urology, biopsy results and further management. social worker aware.   LOS: 9 days   Adison Reifsteck M.D. [facility] 07/02/2012, 1:28 PM Pager: [phone]  If 7PM-7AM, please contact night-coverage [email or web address] Password [email or web address]

## 2012-07-02 NOTE — Progress Notes (Signed)
Subjective: much more alert and talkative Urine clearing Going today for biopsy of LN Renal function continues to improve Still getting IVF at 50/hour  Objective Vital signs in last 24 hours: Filed Vitals:   07/01/12 1400 07/01/12 1749 07/01/12 2018 07/02/12 0500  BP: 134/81 114/67 144/79 130/77  Pulse: 91 106 97 93  Temp: 98 F (36.7 C) 98 F (36.7 C) 98.5 F (36.9 C) 98.2 F (36.8 C)  TempSrc: Oral Oral Oral Oral  Resp: 18 20 18 18   Height:   5\' 9"  (1.753 m)   Weight:   74.2 kg (163 lb 9.3 oz)   SpO2: 94% 100% 95% 97%   Weight change: 0 kg (0 lb)  Intake/Output Summary (Last 24 hours) at 07/02/12 0835 Last data filed at 07/02/12 0700  Gross per 24 hour  Intake   1440 ml  Output   3300 ml  Net  -1860 ml   Physical Exam:  Blood pressure 130/77, pulse 93, temperature 98.2 F (36.8 C), temperature source Oral, resp. rate 18, height 5\' 9"  (1.753 m), weight 74.2 kg (163 lb 9.3 oz), SpO2 97.00%. Awake, alert, more comfortable Clear No Edema Urine no longer bloody in foley   Labs: Basic Metabolic Panel:  Lab 07/02/12 1610 07/01/12 0510 06/30/12 0630 06/29/12 0535 06/28/12 0600 06/27/12 0600 06/26/12 1124  NA 138 135 135 135 135 137 139  K 4.1 4.4 4.4 4.5 5.5* 5.2* 5.4*  CL 102 97 94* 96 98 100 102  CO2 22 24 23 23 25 22 24   GLUCOSE 106* 107* 112* 121* 120* 120* 127*  BUN 57* 71* 64* 53* 51* 50* 53*  CREATININE 3.41* 4.84* 5.37* 5.20* 5.35* 5.53* 5.97*  ALB -- -- -- -- -- -- --  CALCIUM 9.3 9.5 9.8 9.6 9.6 9.6 9.1  PHOS 3.6 6.4* 6.8* 6.7* 5.1* 5.9* --   Liver Function Tests:  Lab 07/02/12 0550 07/01/12 0510 06/30/12 0630  AST -- -- --  ALT -- -- --  ALKPHOS -- -- --  BILITOT -- -- --  PROT -- -- --  ALBUMIN 2.7* 2.7* 3.0*  CBC:  Lab 07/02/12 0550 07/01/12 0824 07/01/12 0510 06/30/12 0610  WBC 10.3 15.5* 15.2* 17.6*  NEUTROABS -- 12.3* -- --  HGB 7.4* 7.5* 7.6* 8.6*  HCT 21.2* 20.8* 21.5* 24.0*  MCV 79.1 77.3* 77.6* 77.9*  PLT 236 220 227 195    Studies/Results: US Renal  07/01/2012  *RADIOLOGY REPORT*  Clinical Data: Follow up hydronephrosis.  Status post Foley catheter insertion.  RENAL/URINARY TRACT ULTRASOUND COMPLETE  Comparison:  06/24/2012.  Findings:  Right Kidney:  Mild to moderate hydronephrosis.  11 cm long axis. Parenchymal echotexture is within normal limits.  Left Kidney:  Mild to moderate hydronephrosis.  10.9 cm long axis. The parenchymal echotexture is within normal limits.  Bladder:  Decompressed with mural thickening.  Foley catheter present.  IMPRESSION: Unchanged mild to moderate bilateral symmetric hydronephrosis, essentially unchanged from recent ultrasound 06/24/2012.  In conjunction with prior CT, this likely represents compression of the distal ureters in the anatomic pelvis.  No renal sinus or collecting system obstructing lesion is identified.  Original Report Authenticated By: Andreas Newport, M.D.   C Medications:  . sodium chloride 50 mL/hr (07/02/12 0826)   . amLODipine  10 mg Oral Daily  . calcitRIOL  0.25 mcg Oral QODAY  . darbepoetin (ARANESP) injection - NON-DIALYSIS  60 mcg Subcutaneous Q Wed-1800  . pantoprazole  40 mg Oral BID AC  . sodium chloride  3 mL Intravenous Q12H  . sodium chloride  3 mL Intravenous Q12H  . DISCONTD: cefTRIAXone (ROCEPHIN)  IV  1 g Intravenous Q24H    I  have reviewed scheduled and prn medications. 1. Renal failure - unknown baseline creatinine.  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  Renal function continues to improve with foley drainage and IV hydration Still with hydro on ultrasound - Await Dr. 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 Anemia -  Tsat OK.  Started Aranesp 60/week (will likely not be able to get as outpt) (7/31)   4. Prostate cancer -  For lymph node bx today Despite foley still has hydro Dr. Isabel Caprice following.   5. Leukocytosis - urine culture negative Cipro (7/30) May be leukemoid  reaction per ID   6. Dispo - ? Where he will go - says he is homeless. I see that CSW now involved  Final Recommendations:  At this time, given continued improvement in renal function will sign off  Would continue IVF until good po and not NPO for procedures  We can see him in office followup if needed if renal function hits plateau with residual CKD (but really won't know about that until obstruction completely relieved (still has hydro)  Continue Aranesp for anemia (iron status OK, refuses blood transfusion) He is on 60 mcg/week started 7/31  Please call me if additional questions arise or anything needed   Camille Bal, MD [facility] [phone] pager 07/02/2012, 8:35 AM

## 2012-07-02 NOTE — Progress Notes (Signed)
No complications with biopsy site at the left groin.  Patient returned to the floor with no complaints.  Band-aid with minimal shadowing, no active bleeding.

## 2012-07-02 NOTE — Progress Notes (Signed)
PT NOTE   07/02/12 1400  PT Visit Information  Last PT Received On 07/02/12  Reason Eval/Treat Not Completed Patient refused  PT General Charges  $PT Canceled Visit 1 Procedure   Weldon Picking PT [facility] [phone] Beeper [phone]

## 2012-07-03 LAB — RENAL FUNCTION PANEL
CO2: 22 mEq/L (ref 19–32)
Calcium: 9.2 mg/dL (ref 8.4–10.5)
Chloride: 99 mEq/L (ref 96–112)
Creatinine, Ser: 2.47 mg/dL — ABNORMAL HIGH (ref 0.50–1.35)
GFR calc non Af Amer: 27 mL/min — ABNORMAL LOW (ref >90)
Glucose, Bld: 102 mg/dL — ABNORMAL HIGH (ref 70–99)

## 2012-07-03 NOTE — Progress Notes (Signed)
Progress note followup to consultation:  Return visit is made just as the patient has been returned from biopsy of prostate (elevated PSA).in early afternoon/2/13. Mr. Waino Mounsey is sedated from the pre-op medication.Marland Kitchen He is unable to engage in conversation. He will be followed when he is more awake and alert. Giavanni Odonovan J. Ferol Luz, MD Psychiatrist  07/03/2012 1:38 AM

## 2012-07-03 NOTE — Progress Notes (Signed)
Patient ID: Aj Crunkleton  male  NGE:[id number]    DOB: Feb 02, 1956    DOA: 06/23/2012  PCP: Sheila Oats, MD  Subjective: No specific complaints, eating, no pain, CP, shortness of breath, fevers or chills     Objective: Weight change: -0.1 kg (-3.5 oz)  Intake/Output Summary (Last 24 hours) at 07/03/12 1032 Last data filed at 07/03/12 0700  Gross per 24 hour  Intake    840 ml  Output   3350 ml  Net  -2510 ml   Blood pressure 126/67, pulse 94, temperature 98.3 F (36.8 C), temperature source Oral, resp. rate 18, height 5\' 9"  (1.753 m), weight 74.1 kg (163 lb 5.8 oz), SpO2 100.00%.  Physical Exam: General: Alert and awake, not in any acute distress. HEENT: anicteric sclera, pupils reactive to light and accommodation, EOMI CVS: S1-S2 clear, no murmur rubs or gallops Chest: clear to auscultation bilaterally, no wheezing, rales or rhonchi Abdomen: soft nontender, nondistended, normal bowel sounds, no organomegaly Extremities: no cyanosis, clubbing or edema noted bilaterally   Lab Results: Basic Metabolic Panel:  Lab 07/03/12 4010 07/02/12 0550  NA 133* 138  K 4.4 4.1  CL 99 102  CO2 22 22  GLUCOSE 102* 106*  BUN 37* 57*  CREATININE 2.47* 3.41*  CALCIUM 9.2 9.3  MG -- --  PHOS 3.2 --   Liver Function Tests:  Lab 07/03/12 0535 07/02/12 0550  AST -- --  ALT -- --  ALKPHOS -- --  BILITOT -- --  PROT -- --  ALBUMIN 2.6* 2.7*   CBC:  Lab 07/02/12 0550 07/01/12 0824  WBC 10.3 15.5*  NEUTROABS -- 12.3*  HGB 7.4* 7.5*  HCT 21.2* 20.8*  MCV 79.1 77.3*  PLT 236 220     Micro Results: Recent Results (from the past 240 hour(s))  URINE CULTURE     Status: Normal   Collection Time   06/29/12  3:28 PM      Component Value Range Status Comment   Specimen Description URINE, CATHETERIZED   Final    Special Requests NONE   Final    Culture  Setup Time 06/29/2012 15:48   Final    Colony Count NO GROWTH   Final    Culture NO GROWTH   Final    Report Status 06/30/2012  FINAL   Final   CULTURE, BLOOD (ROUTINE X 2)     Status: Normal (Preliminary result)   Collection Time   06/29/12  3:32 PM      Component Value Range Status Comment   Specimen Description BLOOD LEFT ARM   Final    Special Requests BOTTLES DRAWN AEROBIC ONLY 7CC   Final    Culture  Setup Time 06/30/2012 00:49   Final    Culture     Final    Value:        BLOOD CULTURE RECEIVED NO GROWTH TO DATE CULTURE WILL BE HELD FOR 5 DAYS BEFORE ISSUING A FINAL NEGATIVE REPORT   Report Status PENDING   Incomplete   CULTURE, BLOOD (ROUTINE X 2)     Status: Normal (Preliminary result)   Collection Time   06/29/12  3:35 PM      Component Value Range Status Comment   Specimen Description BLOOD LEFT ARM   Final    Special Requests BOTTLES DRAWN AEROBIC ONLY 5CC   Final    Culture  Setup Time 06/30/2012 00:48   Final    Culture     Final    Value:  BLOOD CULTURE RECEIVED NO GROWTH TO DATE CULTURE WILL BE HELD FOR 5 DAYS BEFORE ISSUING A FINAL NEGATIVE REPORT   Report Status PENDING   Incomplete     Studies/Results: Dg Chest 2 View  06/23/2012  *RADIOLOGY REPORT*  Clinical Data: Acute renal failure  CHEST - 2 VIEW  Comparison: None.  Findings: Normal heart size.  Lungs are clear.  No pneumothorax and no pleural effusion.  IMPRESSION: No active cardiopulmonary disease.  Original Report Authenticated By: Donavan Burnet, M.D.   Ct Pelvis Wo Contrast  06/28/2012  *  IMPRESSION: 1.  The prostate gland does not appear to be enlarged and is relatively unremarkable in appearance on this noncontrast CT examination. This does not exclude the presence of prostate cancer. 2.  However, there is a profound lymphadenopathy in the pelvis, most notably in the external iliac chains bilaterally, measuring up to 4.4 x 7.3 cm on the right and 5.4 x 4.3 cm on the left. Clinical correlation for signs and symptoms of lymphoproliferative disorder is recommended. 3.  These lymph nodes exert mass effect upon adjacent vascular  structures, particularly on the right, and there is extensive stranding around the right external iliac artery and vein.  This could suggest the presence of deep venous thrombosis.  Clinical correlation for signs and symptoms of right lower extremity edema is recommended.  If there is clinical concern for right lower extremity deep venous thrombosis, further evaluation with Doppler ultrasound may be warranted. 4.  A Foley balloon catheter is present within the lumen of the urinary bladder.  The urinary bladder appears nearly completely decompressed, but demonstrate some potential wall thickening and there are inflammatory changes surrounding the urinary bladder in the perivesical fat.  This could be secondary to inflammation or infiltration from neoplasm.  Clinical correlation is recommended.  These results were called by telephone on 06/28/2012 at 04:50 p.m. to Dr. Isabel Caprice, who verbally acknowledged these results. Subsequently, findings were also discussed with Dr. Cena Benton by Dr. Llana Aliment on 06/28/2012 of 05:05 p.m.  Original Report Authenticated By: Florencia Reasons, M.D.   Nm Bone Scan Whole Body  06/27/2012  *RADIOLOGY REPORT*  Clinical Data: Elevated PSA.  NUCLEAR MEDICINE WHOLE BODY BONE SCINTIGRAPHY  Technique:  Whole body anterior and posterior images were obtained approximately 3 hours after intravenous injection of radiopharmaceutical.  Radiopharmaceutical: CURIE TC-MDP TECHNETIUM TC 65M MEDRONATE IV KIT  Comparison: ultrasound 06/24/2012.  Findings: There are mild degenerative changes but no findings suspicious for osseous metastatic disease.  Mild bilateral hydronephrosis is suspected.  IMPRESSION: Negative bone scan for osseous metastatic disease.  Original Report Authenticated By: P. Loralie Champagne, M.D.   US Renal  06/24/2012  *RADIOLOGY REPORT*  Clinical Data: Renal failure.  RENAL/URINARY TRACT ULTRASOUND COMPLETE  Comparison:  None.  Findings:  Right Kidney:  The right kidney measures  10.6 cm in length with slightly increased echotexture.  There is mild/moderate right hydronephrosis.  Left Kidney:  The left kidney measures 11.3 cm in length.  There is mild/moderate left hydronephrosis.  Bladder:  The urinary bladder is filled with urine.  There are bilateral ureteral jets.  IMPRESSION: Mild/moderate bilateral hydronephrosis.  The urinary bladder is distended and there are bilateral ureteral jets.  Findings raise concern for urinary bladder retention and/or bladder outlet obstruction.  Original Report Authenticated By: Richarda Overlie, M.D.    Medications: Scheduled Meds:    . amLODipine  10 mg Oral Daily  . calcitRIOL  0.25 mcg Oral QODAY  . darbepoetin (ARANESP)  injection - NON-DIALYSIS  60 mcg Subcutaneous Q Wed-1800  . fentaNYL      . midazolam      . pantoprazole  40 mg Oral BID AC  . sodium chloride  3 mL Intravenous Q12H  . DISCONTD: sodium chloride  3 mL Intravenous Q12H   Continuous Infusions:    . sodium chloride 50 mL/hr (07/02/12 0826)     Assessment/Plan: Principal Problem:  *Obstructive uropathy with acute renal failure - Repeat Renal ultrasound showed no significant change in bilateral hydronephrosis despite the Foley catheter  - CT of the pelvis recently showed enlarged lymph nodes at concern for either metastatic prostate CA versus lymphoma - Unknown baseline creatinine, continue IV fluids and Foley drainage, placed on regular diet, creatinine function improved to 2.47 today with gentle hydration - CT guided biopsy  of the right iliac lymph node done, biopsy results pending today, awaiting further recommendations from urology regarding the unchanged bilateral hydronephrosis, if perc nephrostomy tube is needed, d/w Dr Annabell Howells  Active Problems:  Anemia: Patient started on Aranesp weekly while inpatient. - Discussed with patient regarding packed RBC transfusion but he refused. Recheck CBC.   Elevated blood pressure/hypertension - Continue amlodipine, PRN  hydralazine  Lymphadenopathy: See #1, CT guided biopsy done, results pending    Leukocytosis:  -Likely leukemoid reaction, appreciate ID recommendations.  Patient's paranoid behavior: Resolved, appreciate psychiatry recommendations. I believe, being n.p.o. status and wanting regular diet, having no control over his medical situation placed him in the somewhat acute paranoid behavior.  DVT Prophylaxis: SCDs  Code Status: Full Disposition: Unclear, due to patient's homeless situation and will need followup care with urology, biopsy results and further management. social worker aware.   LOS: 10 days   Parker Wherley M.D. [facility] 07/03/2012, 10:32 AM Pager: [phone]  If 7PM-7AM, please contact night-coverage [email or web address] Password [email or web address]

## 2012-07-03 NOTE — Progress Notes (Signed)
Patient ID: Manuel Lucas, male   DOB: 05/25/1956, 56 y.o.   MRN: [id number]    Subjective: Manuel Lucas has had his node biopsy and with his PSA of 106 clinically he has metastatic prostate cancer.   He is having some discomfort from the foley but it is draining well and his UOP is good.  His Creatinine is falling with catheter drainage.  I have reviewed his renal US films and compared them with the pre-catheter study.  He has residual bilateral hydronephrosis but it is reduced. ROS: Patient is poorly responsive.   Objective: Vital signs in last 24 hours: Temp:  [97.6 F (36.4 C)-98.7 F (37.1 C)] 98.3 F (36.8 C) (08/03 0854) Pulse Rate:  [85-99] 94  (08/03 0854) Resp:  [15-18] 18  (08/03 0854) BP: (110-135)/(67-81) 126/67 mmHg (08/03 0854) SpO2:  [92 %-100 %] 100 % (08/03 0854) Weight:  [74.1 kg (163 lb 5.8 oz)] 74.1 kg (163 lb 5.8 oz) (08/02 2052)  Intake/Output from previous day: 08/02 0701 - 08/03 0700 In: 840 [P.O.:840] Out: 3350 [Urine:3350] Intake/Output this shift:    General appearance: no distress and slowed mentation  Lab Results:   Basename 07/02/12 0550 07/01/12 0824  WBC 10.3 15.5*  HGB 7.4* 7.5*  HCT 21.2* 20.8*  PLT 236 220   BMET  Basename 07/03/12 0535 07/02/12 0550  NA 133* 138  K 4.4 4.1  CL 99 102  CO2 22 22  GLUCOSE 102* 106*  BUN 37* 57*  CREATININE 2.47* 3.41*  CALCIUM 9.2 9.3   PT/INR No results found for this basename: LABPROT:2,INR:2 in the last 72 hours ABG No results found for this basename: PHART:2,PCO2:2,PO2:2,HCO3:2 in the last 72 hours  Studies/Results: US Renal  07/01/2012  *RADIOLOGY REPORT*  Clinical Data: Follow up hydronephrosis.  Status post Foley catheter insertion.  RENAL/URINARY TRACT ULTRASOUND COMPLETE  Comparison:  06/24/2012.  Findings:  Right Kidney:  Mild to moderate hydronephrosis.  11 cm long axis. Parenchymal echotexture is within normal limits.  Left Kidney:  Mild to moderate hydronephrosis.  10.9 cm long axis.  The parenchymal echotexture is within normal limits.  Bladder:  Decompressed with mural thickening.  Foley catheter present.  IMPRESSION: Unchanged mild to moderate bilateral symmetric hydronephrosis, essentially unchanged from recent ultrasound 06/24/2012.  In conjunction with prior CT, this likely represents compression of the distal ureters in the anatomic pelvis.  No renal sinus or collecting system obstructing lesion is identified.  Original Report Authenticated By: Andreas Newport, M.D.   Ct Biopsy  07/02/2012  *RADIOLOGY REPORT*  Clinical Data: Prostate cancer, bilateral iliac lymph node masses  CT FLUOROSCOPY GUIDED LEFT ILIAC LYMPH NODE MASS 18 GAUGE CORE BIOPSY  Date:  07/02/2012 12:20:00  Radiologist:  M. Ruel Favors, M.D.  Medications:  75 mcg Fentanyl, 2 mg Versed  Guidance:  CT fluoroscopy  Fluoroscopy time:  7 seconds  Sedation time:  10 minutes.  Contrast volume:  None.  Complications:  No immediate  PROCEDURE/FINDINGS:  Informed consent was obtained from the patient following explanation of the procedure, risks, benefits and alternatives. The patient understands, agrees and consents for the procedure. All questions were addressed.  A time out was performed.  Maximal barrier sterile technique utilized including caps, mask, sterile gowns, sterile gloves, large sterile drape, hand hygiene, and betadine  Previous imaging reviewed.  The patient was positioned supine. Noncontrast localization CT performed.  Left iliac lymph node mass localized.  Under sterile conditions and local anesthesia, a 17 gauge 11.8 cm access needle was advanced  from an anterior oblique approach along the psoas muscle.  Needle position confirmed lymph node mass.  6 18 gauge core biopsies obtained and placed in saline. No immediate complication.  The patient tolerated the procedure well.  IMPRESSION: Successful CT fluoroscopy guided left iliac lymph node mass 18 gauge core biopsies  Original Report Authenticated By: Judie Petit. Ruel Favors, M.D.    Anti-infectives: Anti-infectives     Start     Dose/Rate Route Frequency Ordered Stop   06/30/12 1400   cefTRIAXone (ROCEPHIN) 1 g in dextrose 5 % 50 mL IVPB  Status:  Discontinued        1 g 100 mL/hr over 30 Minutes Intravenous Every 24 hours 06/30/12 1345 07/01/12 0951   06/29/12 2000   ciprofloxacin (CIPRO) IVPB 400 mg  Status:  Discontinued        400 mg 200 mL/hr over 60 Minutes Intravenous Every 24 hours 06/29/12 1904 06/30/12 1344          Current Facility-Administered Medications  Medication Dose Route Frequency Provider Last Rate Last Dose  . 0.9 %  sodium chloride infusion   Intravenous Continuous Sadie Haber, MD 50 mL/hr at 07/02/12 0826 50 mL/hr at 07/02/12 0826  . acetaminophen (TYLENOL) tablet 650 mg  650 mg Oral Q6H PRN Eduard Clos, MD   650 mg at 06/28/12 0805   Or  . acetaminophen (TYLENOL) suppository 650 mg  650 mg Rectal Q6H PRN Eduard Clos, MD      . amLODipine (NORVASC) tablet 10 mg  10 mg Oral Daily Penny Pia, MD   10 mg at 07/03/12 0924  . calcitRIOL (ROCALTROL) capsule 0.25 mcg  0.25 mcg Oral QODAY Sadie Haber, MD   0.25 mcg at 07/02/12 1006  . darbepoetin (ARANESP) injection 60 mcg  60 mcg Subcutaneous Q Wed-1800 Sadie Haber, MD   60 mcg at 06/30/12 1755  . fentaNYL (SUBLIMAZE) 0.05 MG/ML injection           . hydrALAZINE (APRESOLINE) injection 10 mg  10 mg Intravenous Q4H PRN Eduard Clos, MD   10 mg at 06/26/12 1754  . midazolam (VERSED) 2 MG/2ML injection           . morphine 2 MG/ML injection 1 mg  1 mg Intravenous Q2H PRN Penny Pia, MD   1 mg at 06/27/12 1651  . ondansetron (ZOFRAN) tablet 4 mg  4 mg Oral Q6H PRN Eduard Clos, MD       Or  . ondansetron [facility]) injection 4 mg  4 mg Intravenous Q6H PRN Eduard Clos, MD      . oxyCODONE (Oxy IR/ROXICODONE) immediate release tablet 10 mg  10 mg Oral Q4H PRN Penny Pia, MD      . pantoprazole (PROTONIX) EC tablet 40 mg  40 mg  Oral BID AC Eduard Clos, MD   40 mg at 07/03/12 8295  . simethicone (MYLICON) chewable tablet 80 mg  80 mg Oral Q6H PRN Penny Pia, MD      . sodium chloride 0.9 % injection 3 mL  3 mL Intravenous Q12H Penny Pia, MD   3 mL at 06/30/12 0956  . DISCONTD: sodium chloride 0.9 % injection 3 mL  3 mL Intravenous Q12H Eduard Clos, MD   3 mL at 07/02/12 1007  . DISCONTD: sodium chloride 0.9 % injection 3 mL  3 mL Intravenous PRN Penny Pia, MD       Facility-Administered Medications Ordered in Other  Encounters  Medication Dose Route Frequency Provider Last Rate Last Dose  . 0.9 %  sodium chloride infusion   Intravenous Continuous PRN Michael T. Shick, MD 50 mL/hr at 07/02/12 1225 50 mL/hr at 07/02/12 1225  . fentaNYL (SUBLIMAZE) injection   Intravenous PRN Casimiro Needle T. Shick, MD   25 mcg at 07/02/12 1230  . midazolam (VERSED) 5 MG/5ML injection   Intravenous PRN Michael T. Shick, MD   1 mg at 07/02/12 1230    Assessment: He has a falling creatinine with foley drainage which suggests that the hydro is more from bladder outlet obstruction than ureteral obstruction despite the size of the pelvic nodes.   His PSA is 106 and clinically he has metastatic prostate cancer.  Plan: Continue foley drainage for now. Once biopsy results are available, he will need androgen ablation therapy with either medical or surgical castration.    I have not discussed with the patient.  While in the hospital, if prostate cancer is confirmed as I would expect, firmagon 240mg  sq would be a very good initial treatment option. Dr. Isabel Caprice will see Monday.   LOS: 10 days    Gordon Carlson J 07/03/2012

## 2012-07-04 LAB — CBC
Hemoglobin: 7.4 g/dL — ABNORMAL LOW (ref 13.0–17.0)
MCV: 81.1 fL (ref 78.0–100.0)
Platelets: 281 10*3/uL (ref 150–400)
RBC: 2.7 MIL/uL — ABNORMAL LOW (ref 4.22–5.81)
WBC: 10.4 10*3/uL (ref 4.0–10.5)

## 2012-07-04 LAB — RENAL FUNCTION PANEL
Albumin: 2.5 g/dL — ABNORMAL LOW (ref 3.5–5.2)
BUN: 31 mg/dL — ABNORMAL HIGH (ref 6–23)
CO2: 24 mEq/L (ref 19–32)
Chloride: 99 mEq/L (ref 96–112)
Creatinine, Ser: 2.31 mg/dL — ABNORMAL HIGH (ref 0.50–1.35)
GFR calc non Af Amer: 29 mL/min — ABNORMAL LOW (ref >90)
Potassium: 4.5 mEq/L (ref 3.5–5.1)

## 2012-07-04 NOTE — Progress Notes (Signed)
Patient ID: Manuel Lucas  male  ZOX:[id number]    DOB: 09/10/1956    DOA: 06/23/2012  PCP: Sheila Oats, MD  Subjective: No specific complaints, no pain, CP, shortness of breath, fevers or chills, watching TV     Objective: Weight change: 2.104 kg (4 lb 10.2 oz)  Intake/Output Summary (Last 24 hours) at 07/04/12 1058 Last data filed at 07/04/12 0455  Gross per 24 hour  Intake    240 ml  Output   3425 ml  Net  -3185 ml   Blood pressure 120/75, pulse 86, temperature 97.5 F (36.4 C), temperature source Oral, resp. rate 18, height 5\' 9"  (1.753 m), weight 76.204 kg (168 lb), SpO2 99.00%.  Physical Exam: General: Alert and awake, not in any acute distress. HEENT: anicteric sclera, pupils reactive to light and accommodation, EOMI CVS: S1-S2 clear, no murmur rubs or gallops Chest: clear to auscultation bilaterally, no wheezing, rales or rhonchi Abdomen: soft nontender, nondistended, normal bowel sounds, no organomegaly Extremities: no cyanosis, clubbing or edema noted bilaterally GU : Foley catheter present  Lab Results: Basic Metabolic Panel:  Lab 07/04/12 8119 07/03/12 0535  NA 133* 133*  K 4.5 4.4  CL 99 99  CO2 24 22  GLUCOSE 97 102*  BUN 31* 37*  CREATININE 2.31* 2.47*  CALCIUM 8.9 9.2  MG -- --  PHOS 2.9 --   Liver Function Tests:  Lab 07/04/12 0525 07/03/12 0535  AST -- --  ALT -- --  ALKPHOS -- --  BILITOT -- --  PROT -- --  ALBUMIN 2.5* 2.6*   CBC:  Lab 07/04/12 0525 07/02/12 0550 07/01/12 0824  WBC 10.4 10.3 --  NEUTROABS -- -- 12.3*  HGB 7.4* 7.4* --  HCT 21.9* 21.2* --  MCV 81.1 79.1 --  PLT 281 236 --     Micro Results: Recent Results (from the past 240 hour(s))  URINE CULTURE     Status: Normal   Collection Time   06/29/12  3:28 PM      Component Value Range Status Comment   Specimen Description URINE, CATHETERIZED   Final    Special Requests NONE   Final    Culture  Setup Time 06/29/2012 15:48   Final    Colony Count NO GROWTH    Final    Culture NO GROWTH   Final    Report Status 06/30/2012 FINAL   Final   CULTURE, BLOOD (ROUTINE X 2)     Status: Normal (Preliminary result)   Collection Time   06/29/12  3:32 PM      Component Value Range Status Comment   Specimen Description BLOOD LEFT ARM   Final    Special Requests BOTTLES DRAWN AEROBIC ONLY 7CC   Final    Culture  Setup Time 06/30/2012 00:49   Final    Culture     Final    Value:        BLOOD CULTURE RECEIVED NO GROWTH TO DATE CULTURE WILL BE HELD FOR 5 DAYS BEFORE ISSUING A FINAL NEGATIVE REPORT   Report Status PENDING   Incomplete   CULTURE, BLOOD (ROUTINE X 2)     Status: Normal (Preliminary result)   Collection Time   06/29/12  3:35 PM      Component Value Range Status Comment   Specimen Description BLOOD LEFT ARM   Final    Special Requests BOTTLES DRAWN AEROBIC ONLY 5CC   Final    Culture  Setup Time 06/30/2012 00:48   Final  Culture     Final    Value:        BLOOD CULTURE RECEIVED NO GROWTH TO DATE CULTURE WILL BE HELD FOR 5 DAYS BEFORE ISSUING A FINAL NEGATIVE REPORT   Report Status PENDING   Incomplete     Studies/Results: Dg Chest 2 View  06/23/2012  *RADIOLOGY REPORT*  Clinical Data: Acute renal failure  CHEST - 2 VIEW  Comparison: None.  Findings: Normal heart size.  Lungs are clear.  No pneumothorax and no pleural effusion.  IMPRESSION: No active cardiopulmonary disease.  Original Report Authenticated By: Donavan Burnet, M.D.   Ct Pelvis Wo Contrast  06/28/2012  *  IMPRESSION: 1.  The prostate gland does not appear to be enlarged and is relatively unremarkable in appearance on this noncontrast CT examination. This does not exclude the presence of prostate cancer. 2.  However, there is a profound lymphadenopathy in the pelvis, most notably in the external iliac chains bilaterally, measuring up to 4.4 x 7.3 cm on the right and 5.4 x 4.3 cm on the left. Clinical correlation for signs and symptoms of lymphoproliferative disorder is recommended. 3.   These lymph nodes exert mass effect upon adjacent vascular structures, particularly on the right, and there is extensive stranding around the right external iliac artery and vein.  This could suggest the presence of deep venous thrombosis.  Clinical correlation for signs and symptoms of right lower extremity edema is recommended.  If there is clinical concern for right lower extremity deep venous thrombosis, further evaluation with Doppler ultrasound may be warranted. 4.  A Foley balloon catheter is present within the lumen of the urinary bladder.  The urinary bladder appears nearly completely decompressed, but demonstrate some potential wall thickening and there are inflammatory changes surrounding the urinary bladder in the perivesical fat.  This could be secondary to inflammation or infiltration from neoplasm.  Clinical correlation is recommended.  These results were called by telephone on 06/28/2012 at 04:50 p.m. to Dr. Isabel Caprice, who verbally acknowledged these results. Subsequently, findings were also discussed with Dr. Cena Benton by Dr. Llana Aliment on 06/28/2012 of 05:05 p.m.  Original Report Authenticated By: Florencia Reasons, M.D.   Nm Bone Scan Whole Body  06/27/2012  *RADIOLOGY REPORT*  Clinical Data: Elevated PSA.  NUCLEAR MEDICINE WHOLE BODY BONE SCINTIGRAPHY  Technique:  Whole body anterior and posterior images were obtained approximately 3 hours after intravenous injection of radiopharmaceutical.  Radiopharmaceutical: CURIE TC-MDP TECHNETIUM TC 29M MEDRONATE IV KIT  Comparison: ultrasound 06/24/2012.  Findings: There are mild degenerative changes but no findings suspicious for osseous metastatic disease.  Mild bilateral hydronephrosis is suspected.  IMPRESSION: Negative bone scan for osseous metastatic disease.  Original Report Authenticated By: P. Loralie Champagne, M.D.   US Renal  06/24/2012  *RADIOLOGY REPORT*  Clinical Data: Renal failure.  RENAL/URINARY TRACT ULTRASOUND COMPLETE  Comparison:   None.  Findings:  Right Kidney:  The right kidney measures 10.6 cm in length with slightly increased echotexture.  There is mild/moderate right hydronephrosis.  Left Kidney:  The left kidney measures 11.3 cm in length.  There is mild/moderate left hydronephrosis.  Bladder:  The urinary bladder is filled with urine.  There are bilateral ureteral jets.  IMPRESSION: Mild/moderate bilateral hydronephrosis.  The urinary bladder is distended and there are bilateral ureteral jets.  Findings raise concern for urinary bladder retention and/or bladder outlet obstruction.  Original Report Authenticated By: Richarda Overlie, M.D.    Medications: Scheduled Meds:    . amLODipine  10 mg Oral Daily  . calcitRIOL  0.25 mcg Oral QODAY  . darbepoetin (ARANESP) injection - NON-DIALYSIS  60 mcg Subcutaneous Q Wed-1800  . pantoprazole  40 mg Oral BID AC  . sodium chloride  3 mL Intravenous Q12H   Continuous Infusions:    . sodium chloride 50 mL/hr at 07/03/12 1240     Assessment/Plan: Principal Problem:  *Obstructive uropathy with acute renal failure - Repeat Renal ultrasound showed no significant change in bilateral hydronephrosis despite the Foley catheter  - CT of the pelvis recently showed enlarged lymph nodes at concern for either metastatic prostate CA versus lymphoma - Unknown baseline creatinine, continue IV fluids and Foley drainage, placed on regular diet, creatinine function improved to 2.3 today  - Appreciate urology recommendations  Active Problems:  Anemia: Patient started on Aranesp weekly while inpatient. - Discussed with patient regarding packed RBC transfusion but he refused. Recheck CBC.   Elevated blood pressure/hypertension - Continue amlodipine, PRN hydralazine  Lymphadenopathy: See #1, CT guided biopsy done, results pending    Leukocytosis:  -Likely leukemoid reaction, appreciate ID recommendations.  Patient's paranoid behavior: Resolved, appreciate psychiatry recommendations. I  believe, being n.p.o. status and wanting regular diet, having no control over his medical situation placed him in the somewhat acute paranoid behavior.  DVT Prophylaxis: SCDs  Code Status: Full  Disposition: Unclear, due to patient's homeless situation and will need followup care with urology, biopsy results and further management. social worker aware.   LOS: 11 days   Marcie Shearon M.D. [facility] 07/04/2012, 10:58 AM Pager: [phone]  If 7PM-7AM, please contact night-coverage [email or web address] Password [email or web address]

## 2012-07-05 LAB — CBC
HCT: 21.9 % — ABNORMAL LOW (ref 39.0–52.0)
Hemoglobin: 7.4 g/dL — ABNORMAL LOW (ref 13.0–17.0)
RBC: 2.69 MIL/uL — ABNORMAL LOW (ref 4.22–5.81)

## 2012-07-05 LAB — RENAL FUNCTION PANEL
CO2: 22 mEq/L (ref 19–32)
Chloride: 100 mEq/L (ref 96–112)
GFR calc Af Amer: 37 mL/min — ABNORMAL LOW (ref >90)
GFR calc non Af Amer: 32 mL/min — ABNORMAL LOW (ref >90)
Glucose, Bld: 107 mg/dL — ABNORMAL HIGH (ref 70–99)
Sodium: 133 mEq/L — ABNORMAL LOW (ref 135–145)

## 2012-07-05 MED ORDER — DOCUSATE SODIUM 100 MG PO CAPS
100.0000 mg | ORAL_CAPSULE | Freq: Every day | ORAL | Status: DC
  Administered 2012-07-05 – 2012-07-12 (×8): 100 mg via ORAL
  Filled 2012-07-05 (×8): qty 1

## 2012-07-05 NOTE — Progress Notes (Signed)
Patient ID: Kainoah Bartosiewicz, male   DOB: 07-Jul-1956, 56 y.o.   MRN: [id number]   Subjective: Patient reports no new complaints. Renal fx improving nicely. LN bx still pending.  Objective: Vital signs in last 24 hours: Temp:  [97.6 F (36.4 C)-99.5 F (37.5 C)] 97.9 F (36.6 C) (08/05 1321) Pulse Rate:  [82-103] 96  (08/05 1321) Resp:  [16-18] 17  (08/05 1321) BP: (90-115)/(46-77) 109/66 mmHg (08/05 1321) SpO2:  [95 %-100 %] 100 % (08/05 1321) Weight:  [73.437 kg (161 lb 14.4 oz)] 73.437 kg (161 lb 14.4 oz) (08/04 2129)  Intake/Output from previous day: 08/04 0701 - 08/05 0700 In: 1320 [P.O.:720; I.V.:600] Out: 2700 [Urine:2700] Intake/Output this shift: Total I/O In: 1410 [P.O.:960; I.V.:450] Out: 1451 [Urine:1450; Stool:1]  Physical Exam:  Constitutional: Vital signs reviewed. WD WN in NAD       Lab Results:  Basename 07/05/12 0525 07/04/12 0525  HGB 7.4* 7.4*  HCT 21.9* 21.9*   BMET  Basename 07/05/12 0525 07/04/12 0525  NA 133* 133*  K 5.1 4.5  CL 100 99  CO2 22 24  GLUCOSE 107* 97  BUN 27* 31*  CREATININE 2.17* 2.31*  CALCIUM 9.1 8.9   No results found for this basename: LABPT:3,INR:3 in the last 72 hours No results found for this basename: LABURIN:1 in the last 72 hours Results for orders placed during the hospital encounter of 06/23/12  URINE CULTURE     Status: Normal   Collection Time   06/29/12  3:28 PM      Component Value Range Status Comment   Specimen Description URINE, CATHETERIZED   Final    Special Requests NONE   Final    Culture  Setup Time 06/29/2012 15:48   Final    Colony Count NO GROWTH   Final    Culture NO GROWTH   Final    Report Status 06/30/2012 FINAL   Final   CULTURE, BLOOD (ROUTINE X 2)     Status: Normal (Preliminary result)   Collection Time   06/29/12  3:32 PM      Component Value Range Status Comment   Specimen Description BLOOD LEFT ARM   Final    Special Requests BOTTLES DRAWN AEROBIC ONLY 7CC   Final    Culture  Setup  Time 06/30/2012 00:49   Final    Culture     Final    Value:        BLOOD CULTURE RECEIVED NO GROWTH TO DATE CULTURE WILL BE HELD FOR 5 DAYS BEFORE ISSUING A FINAL NEGATIVE REPORT   Report Status PENDING   Incomplete   CULTURE, BLOOD (ROUTINE X 2)     Status: Normal (Preliminary result)   Collection Time   06/29/12  3:35 PM      Component Value Range Status Comment   Specimen Description BLOOD LEFT ARM   Final    Special Requests BOTTLES DRAWN AEROBIC ONLY 5CC   Final    Culture  Setup Time 06/30/2012 00:48   Final    Culture     Final    Value:        BLOOD CULTURE RECEIVED NO GROWTH TO DATE CULTURE WILL BE HELD FOR 5 DAYS BEFORE ISSUING A FINAL NEGATIVE REPORT   Report Status PENDING   Incomplete     Studies/Results: No results found.  Assessment/Plan:   Discussed channel TURP with pt. Will set up for 8-7. Will arrange carelink to [facility] and then transfer back to [facility].  Would likely be able to D/c 2-3 days post-op   LOS: 12 days   Tamre Cass S 07/05/2012, 6:41 PM

## 2012-07-05 NOTE — Progress Notes (Signed)
Physical Therapy Treatment Patient Details Name: Manuel Lucas MRN: [id number] DOB: Apr 16, 1956 Today's Date: 07/05/2012 Time: 3244-0102 PT Time Calculation (min): 34 min  PT Assessment / Plan / Recommendation Comments on Treatment Session  Pt had fallen early this am, and told this therapist he could not get OOB; I explained to pt that he can get OOB and walking with help; Limited distance today per pt choice; continue to agree with SNF for dc    Follow Up Recommendations  Skilled nursing facility    Barriers to Discharge        Equipment Recommendations  Defer to next venue    Recommendations for Other Services    Frequency Min 3X/week   Plan Discharge plan remains appropriate;Frequency remains appropriate    Precautions / Restrictions Precautions Precautions: Fall   Pertinent Vitals/Pain Lower abdominal pain with moving; Pt did not rate; cued pt for deep breathing    Mobility  Bed Mobility Bed Mobility: Supine to Sit;Sitting - Scoot to Edge of Bed Supine to Sit: 4: Min assist Sitting - Scoot to [place] of Bed: 6: Modified independent (Device/Increase time) Details for Bed Mobility Assistance: Min 1 person handheld assist as pt pulled up from sidelie to sit with handheld assist Transfers Transfers: Sit to Stand;Stand to Sit Sit to Stand: 4: Min assist;From bed Stand to Sit: 5: Supervision;To toilet;With upper extremity assist Details for Transfer Assistance: cueing for hand placement for safety; Noted grimace sith sit to/from stand Ambulation/Gait Ambulation/Gait Assistance: 4: Min guard (without physical contact) Ambulation Distance (Feet): 20 Feet Assistive device: Rolling walker Ambulation/Gait Assistance Details: Less distance today as pt had the direct goal of getting to bathroom; heavy dependence on UE support on RW Gait Pattern: Step-through pattern;Decreased stride length    Exercises     PT Diagnosis:    PT Problem List:   PT Treatment Interventions:     PT  Goals Acute Rehab PT Goals Time For Goal Achievement: 07/14/12 Potential to Achieve Goals: Good Pt will go Supine/Side to Sit: with modified independence PT Goal: Supine/Side to Sit - Progress: Progressing toward goal Pt will go Sit to Stand: with modified independence PT Goal: Sit to Stand - Progress: Progressing toward goal Pt will go Stand to Sit: with modified independence PT Goal: Stand to Sit - Progress: Progressing toward goal Pt will Ambulate: >150 feet;with supervision;with least restrictive assistive device PT Goal: Ambulate - Progress: Not progressing  Visit Information  Last PT Received On: 07/05/12 Assistance Needed: +1    Subjective Data  Subjective: Reports had a fall this morning; Felt like he had a rush to the head, and needed to lie down Patient Stated Goal: back to work   Cognition  Overall Cognitive Status: Appears within functional limits for tasks assessed/performed Arousal/Alertness: Awake/alert Orientation Level: Appears intact for tasks assessed Behavior During Session: [facility] for tasks performed    Balance     End of Session PT - End of Session Equipment Utilized During Treatment:  (Kept recliner close as a safety measure) Activity Tolerance: Patient tolerated treatment well Patient left: with call bell/phone within reach (on toilet; Nurse tech aware, and pt promises to pull call string when ready to get up) Nurse Communication: Mobility status   GP     Olen Pel [place], [place] [phone]  07/05/2012, 3:54 PM

## 2012-07-05 NOTE — Progress Notes (Signed)
Patient ID: Manuel Lucas  male  HQI:[id number]    DOB: 1956/10/11    DOA: 06/23/2012  PCP: Sheila Oats, MD  Subjective: No specific complaints, overnight issues noted, no fall. Patient states that he just wanted to sit on the floor. No chest pain, shortness of breath, fever, chills.     Objective: Weight change: -2.767 kg (-6 lb 1.6 oz)  Intake/Output Summary (Last 24 hours) at 07/05/12 1553 Last data filed at 07/05/12 1500  Gross per 24 hour  Intake   1080 ml  Output   4151 ml  Net  -3071 ml   Blood pressure 109/66, pulse 96, temperature 97.9 F (36.6 C), temperature source Oral, resp. rate 17, height 5\' 9"  (1.753 m), weight 73.437 kg (161 lb 14.4 oz), SpO2 100.00%.  Physical Exam: General: Alert and awake, not in any acute distress. HEENT: anicteric sclera, pupils reactive to light and accommodation, EOMI CVS: S1-S2 clear, no murmur rubs or gallops Chest: clear to auscultation bilaterally, no wheezing, rales or rhonchi Abdomen: soft nontender, nondistended, normal bowel sounds, no organomegaly Extremities: no cyanosis, clubbing or edema noted bilaterally GU : Foley catheter present  Lab Results: Basic Metabolic Panel:  Lab 07/05/12 1324 07/04/12 0525  NA 133* 133*  K 5.1 4.5  CL 100 99  CO2 22 24  GLUCOSE 107* 97  BUN 27* 31*  CREATININE 2.17* 2.31*  CALCIUM 9.1 8.9  MG -- --  PHOS 3.2 --   Liver Function Tests:  Lab 07/05/12 0525 07/04/12 0525  AST -- --  ALT -- --  ALKPHOS -- --  BILITOT -- --  PROT -- --  ALBUMIN 2.5* 2.5*   CBC:  Lab 07/05/12 0525 07/04/12 0525 07/01/12 0824  WBC 9.5 10.4 --  NEUTROABS -- -- 12.3*  HGB 7.4* 7.4* --  HCT 21.9* 21.9* --  MCV 81.4 81.1 --  PLT 276 281 --     Micro Results: Recent Results (from the past 240 hour(s))  URINE CULTURE     Status: Normal   Collection Time   06/29/12  3:28 PM      Component Value Range Status Comment   Specimen Description URINE, CATHETERIZED   Final    Special Requests NONE    Final    Culture  Setup Time 06/29/2012 15:48   Final    Colony Count NO GROWTH   Final    Culture NO GROWTH   Final    Report Status 06/30/2012 FINAL   Final   CULTURE, BLOOD (ROUTINE X 2)     Status: Normal (Preliminary result)   Collection Time   06/29/12  3:32 PM      Component Value Range Status Comment   Specimen Description BLOOD LEFT ARM   Final    Special Requests BOTTLES DRAWN AEROBIC ONLY 7CC   Final    Culture  Setup Time 06/30/2012 00:49   Final    Culture     Final    Value:        BLOOD CULTURE RECEIVED NO GROWTH TO DATE CULTURE WILL BE HELD FOR 5 DAYS BEFORE ISSUING A FINAL NEGATIVE REPORT   Report Status PENDING   Incomplete   CULTURE, BLOOD (ROUTINE X 2)     Status: Normal (Preliminary result)   Collection Time   06/29/12  3:35 PM      Component Value Range Status Comment   Specimen Description BLOOD LEFT ARM   Final    Special Requests BOTTLES DRAWN AEROBIC ONLY 5CC  Final    Culture  Setup Time 06/30/2012 00:48   Final    Culture     Final    Value:        BLOOD CULTURE RECEIVED NO GROWTH TO DATE CULTURE WILL BE HELD FOR 5 DAYS BEFORE ISSUING A FINAL NEGATIVE REPORT   Report Status PENDING   Incomplete     Studies/Results: Dg Chest 2 View  06/23/2012  *RADIOLOGY REPORT*  Clinical Data: Acute renal failure  CHEST - 2 VIEW  Comparison: None.  Findings: Normal heart size.  Lungs are clear.  No pneumothorax and no pleural effusion.  IMPRESSION: No active cardiopulmonary disease.  Original Report Authenticated By: Donavan Burnet, M.D.   Ct Pelvis Wo Contrast  06/28/2012  *  IMPRESSION: 1.  The prostate gland does not appear to be enlarged and is relatively unremarkable in appearance on this noncontrast CT examination. This does not exclude the presence of prostate cancer. 2.  However, there is a profound lymphadenopathy in the pelvis, most notably in the external iliac chains bilaterally, measuring up to 4.4 x 7.3 cm on the right and 5.4 x 4.3 cm on the left. Clinical  correlation for signs and symptoms of lymphoproliferative disorder is recommended. 3.  These lymph nodes exert mass effect upon adjacent vascular structures, particularly on the right, and there is extensive stranding around the right external iliac artery and vein.  This could suggest the presence of deep venous thrombosis.  Clinical correlation for signs and symptoms of right lower extremity edema is recommended.  If there is clinical concern for right lower extremity deep venous thrombosis, further evaluation with Doppler ultrasound may be warranted. 4.  A Foley balloon catheter is present within the lumen of the urinary bladder.  The urinary bladder appears nearly completely decompressed, but demonstrate some potential wall thickening and there are inflammatory changes surrounding the urinary bladder in the perivesical fat.  This could be secondary to inflammation or infiltration from neoplasm.  Clinical correlation is recommended.  These results were called by telephone on 06/28/2012 at 04:50 p.m. to Dr. Isabel Caprice, who verbally acknowledged these results. Subsequently, findings were also discussed with Dr. Cena Benton by Dr. Llana Aliment on 06/28/2012 of 05:05 p.m.  Original Report Authenticated By: Florencia Reasons, M.D.   Nm Bone Scan Whole Body  06/27/2012  *RADIOLOGY REPORT*  Clinical Data: Elevated PSA.  NUCLEAR MEDICINE WHOLE BODY BONE SCINTIGRAPHY  Technique:  Whole body anterior and posterior images were obtained approximately 3 hours after intravenous injection of radiopharmaceutical.  Radiopharmaceutical: CURIE TC-MDP TECHNETIUM TC 81M MEDRONATE IV KIT  Comparison: ultrasound 06/24/2012.  Findings: There are mild degenerative changes but no findings suspicious for osseous metastatic disease.  Mild bilateral hydronephrosis is suspected.  IMPRESSION: Negative bone scan for osseous metastatic disease.  Original Report Authenticated By: P. Loralie Champagne, M.D.   US Renal  06/24/2012  *RADIOLOGY REPORT*   Clinical Data: Renal failure.  RENAL/URINARY TRACT ULTRASOUND COMPLETE  Comparison:  None.  Findings:  Right Kidney:  The right kidney measures 10.6 cm in length with slightly increased echotexture.  There is mild/moderate right hydronephrosis.  Left Kidney:  The left kidney measures 11.3 cm in length.  There is mild/moderate left hydronephrosis.  Bladder:  The urinary bladder is filled with urine.  There are bilateral ureteral jets.  IMPRESSION: Mild/moderate bilateral hydronephrosis.  The urinary bladder is distended and there are bilateral ureteral jets.  Findings raise concern for urinary bladder retention and/or bladder outlet obstruction.  Original Report  Authenticated By: Richarda Overlie, M.D.    Medications: Scheduled Meds:    . amLODipine  10 mg Oral Daily  . calcitRIOL  0.25 mcg Oral QODAY  . darbepoetin (ARANESP) injection - NON-DIALYSIS  60 mcg Subcutaneous Q Wed-1800  . docusate sodium  100 mg Oral Daily  . pantoprazole  40 mg Oral BID AC  . sodium chloride  3 mL Intravenous Q12H   Continuous Infusions:    . sodium chloride 50 mL/hr (07/04/12 2216)     Assessment/Plan: Principal Problem:  *Obstructive uropathy with acute renal failure:  creatinine function improved to 2.1 today - Repeat Renal ultrasound showed no significant change in bilateral hydronephrosis despite the Foley catheter  - CT of the pelvis recently showed enlarged lymph nodes at concern for either metastatic prostate CA versus lymphoma - Unknown baseline creatinine, continue IV fluids, Foley drainage - Appreciate urology recommendations, awaiting further management from urology, ?TURP, biopsy results still pending  Active Problems:  Anemia: Patient started on Aranesp weekly while inpatient, stable . - Discussed with patient regarding packed RBC transfusion but he refused.    HTN- stable - Continue amlodipine, PRN hydralazine  Lymphadenopathy: See #1, CT guided biopsy done on 07/02/12, results pending     Leukocytosis: resolved  -Likely leukemoid reaction, appreciate ID recommendations.  Patient's paranoid behavior: Resolved.  DVT Prophylaxis: SCDs  Code Status: Full  Disposition: Unclear, due to patient's homeless situation and will need followup care with urology, biopsy results and further management. social worker aware.   LOS: 12 days   Sareena Odeh M.D. [facility] 07/05/2012, 3:53 PM Pager: [phone]  If 7PM-7AM, please contact night-coverage [email or web address] Password [email or web address]

## 2012-07-05 NOTE — Clinical Social Work Note (Signed)
CSW talked with patient and updated him about short-term rehab at discharge and bed availability at [facility]. Patient talked with CSW about falling when he tried to do some exercises and some concerns he has about injuries sustained years ago. Patient also advised CSW that the lady from [facility] came today regarding his 'SSI'  (disability) application.  CSW contacted Joni Reining at [facility] and they still intend to accept patient for short-term rehab with an LOG when medically stable. CSW will continue to monitor patient progress and facilitate discharge to [facility] when medically ready.   Genelle Bal, MSW, LCSW [phone]

## 2012-07-05 NOTE — Progress Notes (Signed)
Found patient on floor. States, "I was sitting in bed and felt my blood pressure go up and my chest felt hot. I felt like I needed to lie down, so I lowered myself to the floor." Denies pain or injury. Assessment complete (see doc flowsheets). [id number]/64 O2 sat. 100% on RA. Notified M. Lynch NP. No new orders received. Bed alarm in use. Will obtain orthostatic BP's this am.

## 2012-07-06 ENCOUNTER — Other Ambulatory Visit: Payer: Self-pay | Admitting: Urology

## 2012-07-06 DIAGNOSIS — C61 Malignant neoplasm of prostate: Secondary | ICD-10-CM

## 2012-07-06 LAB — CULTURE, BLOOD (ROUTINE X 2): Culture: NO GROWTH

## 2012-07-06 LAB — CBC
HCT: 18.9 % — ABNORMAL LOW (ref 39.0–52.0)
Hemoglobin: 6.4 g/dL — CL (ref 13.0–17.0)
RBC: 2.33 MIL/uL — ABNORMAL LOW (ref 4.22–5.81)
RDW: 15.5 % (ref 11.5–15.5)
WBC: 8.7 10*3/uL (ref 4.0–10.5)

## 2012-07-06 LAB — PREPARE RBC (CROSSMATCH)

## 2012-07-06 LAB — RENAL FUNCTION PANEL
BUN: 26 mg/dL — ABNORMAL HIGH (ref 6–23)
CO2: 25 mEq/L (ref 19–32)
Chloride: 100 mEq/L (ref 96–112)
GFR calc Af Amer: 35 mL/min — ABNORMAL LOW (ref >90)
Glucose, Bld: 105 mg/dL — ABNORMAL HIGH (ref 70–99)
Phosphorus: 3.3 mg/dL (ref 2.3–4.6)
Potassium: 5 mEq/L (ref 3.5–5.1)
Sodium: 134 mEq/L — ABNORMAL LOW (ref 135–145)

## 2012-07-06 NOTE — Progress Notes (Signed)
Pt. Refused PRBC's transfusion tonight, I was asked by lab to repeat type and screen because the previous one was done at [facility], pt. Agreed to the procedure after more than 30 minutes of explanation as to the reason why we need to type and screen him before transfusing the blood. When told the blood will be given to him tonight when its ready, pt. Said he is not comfortable having blood given to him at night when he is asleep, and that we have no reason to bother people like that when they are asleep, pt. Manuel Lucas it is irresponsible of [place] to treat people like this when they are suppose to be sleeping, I assured pt. that no procedure will be done to him against his will,  charge nurse aware of his refusal.

## 2012-07-06 NOTE — Progress Notes (Signed)
Patient ID: Manuel Lucas, male   DOB: 28-Aug-1956, 56 y.o.   MRN: [id number]   I reassessed [name] last night and discussed the plan to proceed with a limited TURP on 07/07/2012. This will be done by transfer by [facility] to [facility]. The patient's pathology from the lymph node biopsy remains pending but that would not change my management strategy with regard to the TURP. Patient's hemoglobin has fallen to 6.40 will require 2 units of packed red blood cells prior to surgical intervention.

## 2012-07-06 NOTE — Clinical Social Work Note (Signed)
CSW advised that patient will be transferred today to [facility] - room 1433 for his procedure. When medically stable he can discharge to [facility] for [place] rehab.  Genelle Bal, MSW, LCSW [phone]

## 2012-07-06 NOTE — Progress Notes (Signed)
Report called to Manuel Lucas at [facility]. Pt. and ex-wife August Albino notified of transfer. All belongings sent with pt. [facility] in room, report given.

## 2012-07-06 NOTE — Progress Notes (Signed)
CRITICAL VALUE ALERT  Critical value received:  Hbg 6.4  Date of notification:  07/06/2012   Time of notification:  0653  Critical value read back:yes  Nurse who received alert:  Kizzie Fantasia RN  MD notified (1st page):  [place]  Time of first page:  [phone]  MD notified (2nd page):  Time of second page:  Responding MD:  Georgia Dom  Time MD responded:  [phone]

## 2012-07-06 NOTE — Progress Notes (Signed)
Patient ID: Manuel Lucas  male  MVH:[id number]    DOB: August 14, 1956    DOA: 06/23/2012   PCP: Sheila Oats, MD   Brief narrative with hospitalization course so far:  Patient is a 56 year old African American male with no significant past medical history, started noticing that his both lower extremities were swollen over the last 2 days prior to admission. Patient decided to go to the urgent care and his metabolic panel showed elevated creatinine and he was referred to [facility] ED. Patient was also found to be anemic with hemoglobin around 8 patient was admitted for further workup.  At the time of admission patient was found to be in renal failure with a creatinine of 5.88, BUN of 57, renal service was consulted. Renal ultrasound was obtained along with SPEP, UPEP, hepatitis, HIV serologies, PTH. Patient was placed on IV fluid hydration. Renal ultrasound on 06/24/12 showed mild/moderate bilateral hydronephrosis, distended urinary bladder with bilateral ureteral jets concerning for urinary bladder retention or bladder outlet obstruction. Urology was consulted and patient has been followed by Dr. Isabel Caprice. PSA was ordered and Foley catheter was placed. PSA was drawn prior to Foley manipulation and was found to be over 100, he refused rectal examination on numerous occasions. Bone scan on 06/27/2012 did not show any evidence of obvious metastatic disease. Patient had a CT of the pelvis without contrast due to his acute renal insufficiency, which showed profound lymphadenopathy in the pelvis. Patient underwent lymph node biopsy via interventional radiology on 07/02/2012. The biopsy report which resulted today, is consistent with metastatic prostate carcinoma. Patient's renal failure significantly improved (from 6.87 on 06/25/2012 to 2.2 today) with Foley catheter drainage and IV fluids. Patient's hemoglobin has slowly been trending down, to 6.4 on 07/06/2012 and per urology recommendations will be given 2 units  of packed RBCs prior to the TURP surgery tomorrow (07/07/12) at [facility].  Patient has also been demonstrated paranoid behavior during the hospitalization, refusing imagings, procedures. He has been explained every procedure and imagings are several times by the physicians. Psychiatry consultation was also obtained and patient had brief improvement in his behavior and attitude allowing for the lymph node biopsy and further procedures.  Consultants:  Renal, Dr. Eliott Nine - will need follow up appointment in 2 weeks after the discharge from [facility] after TURP  Urology, Dr. Isabel Caprice Psychiatry, Dr. Ferol Luz   Procedures and Radiological studies:  Renal ultrasound on 06/24/2012: Mild/moderate bilateral hydronephrosis. The urinary bladder is distended and there are bilateral ureteral jets. Findings raise concern for urinary bladder retention and/or bladder outlet obstruction.  Bone scan on 06/27/2012 : Negative bone scan for osseus metastatic disease  CT pelvis without contrast on 06/28/2012: Profound lymphadenopathy in the pelvis, most notably in the external iliac chains bilaterally, 4.4 X7 0.3 cm on the right in 5.4 X4 0.3 cm on the left. Lymph nodes exert mass effect upon the adjacent vascular structures particularly on the right.  Renal ultrasound repeated on 07/01/2012: Unchanged mild to moderate bilateral symmetric hydronephrosis.  CT-guided biopsy of the iliac lymph node on 07/02/2012  Left Iliac Lymph Node Biopsy result:              By immunohistochemistry, the malignant cells are positive for cytokeratin AE1/AE3 and focally positive for PSA. They are negative for PSAP, CD45, Melan-A, and S100. Given the clinical scenario, the PSA positive staining is consistent with metastatic prostatic carcinoma.  Antibiotics:  Antibiotics: None  Cultures:  Blood cultures 06/29/2012:  Negative Urine culture  06/29/2012: Negative   Subjective: No specific complaints except refusing  blood transfusion until he sees the actual order, understands the biopsy results and need for the surgery scheduled tomorrow.   Objective: Weight change: 1.542 kg (3 lb 6.4 oz)  Intake/Output Summary (Last 24 hours) at 07/06/12 1339 Last data filed at 07/06/12 0900  Gross per 24 hour  Intake   2010 ml  Output   2026 ml  Net    -16 ml   Blood pressure 106/71, pulse 94, temperature 98.1 F (36.7 C), temperature source Oral, resp. rate 19, height 5\' 9"  (1.753 m), weight 74.98 kg (165 lb 4.8 oz), SpO2 100.00%.  Physical Exam: General: Alert and awake, oriented x3, not in any acute distress. HEENT: anicteric sclera, pupils reactive to light and accommodation, EOMI CVS: S1-S2 clear, no murmur rubs or gallops Chest: clear to auscultation bilaterally, no wheezing, rales or rhonchi Abdomen: soft nontender, nondistended, normal bowel sounds, no organomegaly Extremities: no cyanosis, clubbing or edema noted bilaterally GU: Foley catheter present  Lab Results: Basic Metabolic Panel:  Lab 07/06/12 1610 07/05/12 0525  NA 134* 133*  K 5.0 5.1  CL 100 100  CO2 25 22  GLUCOSE 105* 107*  BUN 26* 27*  CREATININE 2.27* 2.17*  CALCIUM 8.9 9.1  MG -- --  PHOS 3.3 --   Liver Function Tests:  Lab 07/06/12 0555 07/05/12 0525  AST -- --  ALT -- --  ALKPHOS -- --  BILITOT -- --  PROT -- --  ALBUMIN 2.6* 2.5*   CBC:  Lab 07/06/12 0555 07/05/12 0525 07/01/12 0824  WBC 8.7 9.5 --  NEUTROABS -- -- 12.3*  HGB 6.4* 7.4* --  HCT 18.9* 21.9* --  MCV 81.1 81.4 --  PLT 308 276 --    Medications: Scheduled Meds:   . amLODipine  10 mg Oral Daily  . calcitRIOL  0.25 mcg Oral QODAY  . darbepoetin (ARANESP) injection - NON-DIALYSIS  60 mcg Subcutaneous Q Wed-1800  . docusate sodium  100 mg Oral Daily  . pantoprazole  40 mg Oral BID AC  . sodium chloride  3 mL Intravenous Q12H      Assessment/Plan: Principal Problem:  *Prostate carcinoma: Newly diagnosed, PSA 106.2 - Lymph node  biopsy results positive for metastatic prostate carcinoma, bone scan showed no osseus metastatic disease - Discussed with Dr. Isabel Caprice (urology), patient scheduled for TURP surgery tomorrow at [facility]. Will keep n.p.o. after midnight. Per urology, patient can be dc'ed 2-3 days post-op.     Active Problems:  Acute renal failure: Significantly improved from 6.8 to 2.2 today - Continue gentle IV fluid hydration, Foley catheter drainage - Patient will need appointment with Dr. Eliott Nine in 2 weeks after the discharge, will likely not need hemodialysis now.     Anemia: Patient started on Aranesp weekly while inpatient  - Patient has been refusing packed RBC transfusion. He was explained the need for packed RBC transfusion, 2 units for surgery tomorrow.   HTN- stable  - Continue amlodipine, PRN hydralazine   Obstructive uropathy sec to prostate CA, localized enlarged lymph nodes: Creatinine function improved to 2.2  - Continue IV fluids, Foley catheter drainage    Leukocytosis: Likely leukemoid reaction, resolved  Social situation: Patient is homeless, case management and social worker has been assisting with skilled nursing facility placement  Prophylaxis: SCDs   Code Status: Full  Disposition: transfer to [facility] tonight for TURP surgery tomorrow per Urology, Dr Ellin Goodie recommendations under [facility] service (  as primary).     LOS: 13 days   RAI,RIPUDEEP M.D. [facility] 07/06/2012, 1:39 PM Pager: [phone]  If 7PM-7AM, please contact night-coverage [email or web address] Password [email or web address]

## 2012-07-07 ENCOUNTER — Encounter (HOSPITAL_COMMUNITY): Payer: Self-pay | Admitting: Anesthesiology

## 2012-07-07 ENCOUNTER — Ambulatory Visit (HOSPITAL_COMMUNITY): Admission: RE | Admit: 2012-07-07 | Payer: Self-pay | Source: Ambulatory Visit | Admitting: Urology

## 2012-07-07 ENCOUNTER — Inpatient Hospital Stay (HOSPITAL_COMMUNITY): Payer: Self-pay | Admitting: Anesthesiology

## 2012-07-07 ENCOUNTER — Encounter (HOSPITAL_COMMUNITY)
Admission: EM | Disposition: A | Discharge status: Skilled Nursing Facility | Payer: Self-pay | Source: Home / Self Care | Attending: Internal Medicine

## 2012-07-07 HISTORY — PX: TRANSURETHRAL RESECTION OF PROSTATE: SHX73

## 2012-07-07 LAB — TYPE AND SCREEN
Antibody Screen: NEGATIVE
Unit division: 0

## 2012-07-07 LAB — HEMOGLOBIN AND HEMATOCRIT, BLOOD
HCT: 25.3 % — ABNORMAL LOW (ref 39.0–52.0)
Hemoglobin: 8.7 g/dL — ABNORMAL LOW (ref 13.0–17.0)

## 2012-07-07 LAB — SURGICAL PCR SCREEN: Staphylococcus aureus: NEGATIVE

## 2012-07-07 SURGERY — TURP (TRANSURETHRAL RESECTION OF PROSTATE)
Anesthesia: General | Wound class: Clean Contaminated

## 2012-07-07 MED ORDER — CIPROFLOXACIN IN D5W 400 MG/200ML IV SOLN
400.0000 mg | INTRAVENOUS | Status: AC
  Administered 2012-07-07: 400 mg via INTRAVENOUS

## 2012-07-07 MED ORDER — HYDROMORPHONE HCL PF 1 MG/ML IJ SOLN: 0.2500 mg | INTRAMUSCULAR | Status: DC | PRN

## 2012-07-07 MED ORDER — PROMETHAZINE HCL 25 MG/ML IJ SOLN: 6.2500 mg | INTRAMUSCULAR | Status: DC | PRN

## 2012-07-07 MED ORDER — LIDOCAINE HCL (CARDIAC) 20 MG/ML IV SOLN
INTRAVENOUS | Status: DC | PRN
  Administered 2012-07-07: 80 mg via INTRAVENOUS

## 2012-07-07 MED ORDER — CIPROFLOXACIN IN D5W 400 MG/200ML IV SOLN
400.0000 mg | Freq: Two times a day (BID) | INTRAVENOUS | Status: DC
  Administered 2012-07-08 – 2012-07-13 (×11): 400 mg via INTRAVENOUS
  Filled 2012-07-07 (×13): qty 200

## 2012-07-07 MED ORDER — PROPOFOL 10 MG/ML IV EMUL
INTRAVENOUS | Status: DC | PRN
  Administered 2012-07-07: 200 mg via INTRAVENOUS

## 2012-07-07 MED ORDER — LACTATED RINGERS IV SOLN: INTRAVENOUS | Status: DC

## 2012-07-07 MED ORDER — ONDANSETRON HCL 4 MG/2ML IJ SOLN: 4.0000 mg | INTRAMUSCULAR | Status: DC | PRN

## 2012-07-07 MED ORDER — MORPHINE SULFATE 2 MG/ML IJ SOLN
2.0000 mg | INTRAMUSCULAR | Status: DC | PRN
  Administered 2012-07-07: 2 mg via INTRAVENOUS
  Filled 2012-07-07: qty 1

## 2012-07-07 MED ORDER — FENTANYL CITRATE 0.05 MG/ML IJ SOLN
INTRAMUSCULAR | Status: DC | PRN
  Administered 2012-07-07 (×3): 50 ug via INTRAVENOUS

## 2012-07-07 MED ORDER — IOHEXOL 300 MG/ML  SOLN
INTRAMUSCULAR | Status: DC | PRN
  Administered 2012-07-07: 10 mL

## 2012-07-07 MED ORDER — LACTATED RINGERS IV SOLN
INTRAVENOUS | Status: DC | PRN
  Administered 2012-07-07 (×2): via INTRAVENOUS

## 2012-07-07 MED ORDER — LIDOCAINE HCL 2 % EX GEL
CUTANEOUS | Status: DC | PRN
  Administered 2012-07-07: 1 via URETHRAL

## 2012-07-07 MED ORDER — MIDAZOLAM HCL 5 MG/5ML IJ SOLN
INTRAMUSCULAR | Status: DC | PRN
  Administered 2012-07-07: 2 mg via INTRAVENOUS

## 2012-07-07 MED ORDER — IOHEXOL 300 MG/ML  SOLN
INTRAMUSCULAR | Status: AC
  Filled 2012-07-07: qty 1

## 2012-07-07 MED ORDER — DEXTROSE-NACL 5-0.45 % IV SOLN
INTRAVENOUS | Status: DC
  Administered 2012-07-07 – 2012-07-12 (×3): via INTRAVENOUS

## 2012-07-07 MED ORDER — EPHEDRINE SULFATE 50 MG/ML IJ SOLN
INTRAMUSCULAR | Status: DC | PRN
  Administered 2012-07-07: 5 mg via INTRAVENOUS

## 2012-07-07 MED ORDER — BELLADONNA ALKALOIDS-OPIUM 16.2-60 MG RE SUPP
RECTAL | Status: AC
  Filled 2012-07-07: qty 1

## 2012-07-07 MED ORDER — SODIUM CHLORIDE 0.9 % IR SOLN
Status: DC | PRN
  Administered 2012-07-07: 9000 mL via INTRAVESICAL

## 2012-07-07 MED ORDER — INDIGOTINDISULFONATE SODIUM 8 MG/ML IJ SOLN
INTRAMUSCULAR | Status: AC
  Filled 2012-07-07: qty 5

## 2012-07-07 MED ORDER — HYDROCODONE-ACETAMINOPHEN 5-325 MG PO TABS
1.0000 | ORAL_TABLET | ORAL | Status: DC | PRN
  Administered 2012-07-14: 1 via ORAL
  Filled 2012-07-07: qty 2

## 2012-07-07 MED ORDER — CIPROFLOXACIN IN D5W 400 MG/200ML IV SOLN
INTRAVENOUS | Status: AC
  Filled 2012-07-07: qty 200

## 2012-07-07 MED ORDER — ONDANSETRON HCL 4 MG/2ML IJ SOLN
INTRAMUSCULAR | Status: DC | PRN
  Administered 2012-07-07: 4 mg via INTRAVENOUS

## 2012-07-07 MED ORDER — LIDOCAINE HCL 2 % EX GEL
CUTANEOUS | Status: AC
  Filled 2012-07-07: qty 20

## 2012-07-07 MED ORDER — MEPERIDINE HCL 25 MG/ML IJ SOLN: 6.2500 mg | INTRAMUSCULAR | Status: DC | PRN

## 2012-07-07 MED ORDER — INDIGOTINDISULFONATE SODIUM 8 MG/ML IJ SOLN
INTRAMUSCULAR | Status: DC | PRN
  Administered 2012-07-07: 5 mL via INTRAVENOUS

## 2012-07-07 SURGICAL SUPPLY — 37 items
ADAPTER CATH URET PLST 4-6FR (CATHETERS) IMPLANT
BAG URINE DRAINAGE (UROLOGICAL SUPPLIES) ×3 IMPLANT
BAG URO CATCHER STRL LF (DRAPE) ×3 IMPLANT
BASKET ZERO TIP NITINOL 2.4FR (BASKET) IMPLANT
BLADE SURG 15 STRL LF DISP TIS (BLADE) IMPLANT
BLADE SURG 15 STRL SS (BLADE)
CATH FOLEY 2WAY SLVR 30CC 20FR (CATHETERS) ×3 IMPLANT
CATH FOLEY 3WAY 30CC 24FR (CATHETERS)
CATH INTERMIT  6FR 70CM (CATHETERS) IMPLANT
CATH URET 5FR 28IN CONE TIP (BALLOONS)
CATH URET 5FR 70CM CONE TIP (BALLOONS) IMPLANT
CATH URTH STD 24FR FL 3W 2 (CATHETERS) IMPLANT
CLOTH BEACON ORANGE TIMEOUT ST (SAFETY) ×3 IMPLANT
DRAPE CAMERA CLOSED 9X96 (DRAPES) ×3 IMPLANT
ELECT LOOP MED HF 24F 12D (CUTTING LOOP) ×3 IMPLANT
ELECT REM PT RETURN 9FT ADLT (ELECTROSURGICAL) ×3
ELECT RESECT VAPORIZE 12D CBL (ELECTRODE) ×3 IMPLANT
ELECTRODE KNIFE URO 27FR PED (UROLOGICAL SUPPLIES) IMPLANT
ELECTRODE REM PT RTRN 9FT ADLT (ELECTROSURGICAL) ×2 IMPLANT
GLOVE BIOGEL M STRL SZ7.5 (GLOVE) ×3 IMPLANT
GOWN PREVENTION PLUS XLARGE (GOWN DISPOSABLE) ×3 IMPLANT
GOWN STRL NON-REIN LRG LVL3 (GOWN DISPOSABLE) ×3 IMPLANT
GOWN STRL REIN XL XLG (GOWN DISPOSABLE) ×3 IMPLANT
GUIDEWIRE STR DUAL SENSOR (WIRE) ×3 IMPLANT
HOLDER FOLEY CATH W/STRAP (MISCELLANEOUS) IMPLANT
JUMPSUIT BLUE BOOT COVER DISP (PROTECTIVE WEAR) IMPLANT
KIT ASPIRATION TUBING (SET/KITS/TRAYS/PACK) ×3 IMPLANT
KNIFE COLLINS 24FR (ELECTROSURGICAL) IMPLANT
LOOPS RESECTOSCOPE DISP (ELECTROSURGICAL) ×6 IMPLANT
MANIFOLD NEPTUNE II (INSTRUMENTS) ×3 IMPLANT
NS IRRIG 1000ML POUR BTL (IV SOLUTION) ×3 IMPLANT
PACK CYSTO (CUSTOM PROCEDURE TRAY) ×3 IMPLANT
SUT ETHILON 3 0 PS 1 (SUTURE) IMPLANT
SYR 30ML LL (SYRINGE) IMPLANT
SYRINGE IRR TOOMEY STRL 70CC (SYRINGE) ×3 IMPLANT
TUBING CONNECTING 10 (TUBING) ×3 IMPLANT
WIRE COONS/BENSON .038X145CM (WIRE) IMPLANT

## 2012-07-07 NOTE — Anesthesia Postprocedure Evaluation (Signed)
  Anesthesia Post-op Note  Patient: Manuel Lucas  Procedure(s) Performed: Procedure(s) (LRB): TRANSURETHRAL RESECTION OF THE PROSTATE (TURP) (N/A)  Patient Location: PACU  Anesthesia Type: General  Level of Consciousness: awake and alert   Airway and Oxygen Therapy: Patient Spontanous Breathing  Post-op Pain: mild  Post-op Assessment: Post-op Vital signs reviewed, Patient's Cardiovascular Status Stable, Respiratory Function Stable, Patent Airway and No signs of Nausea or vomiting  Post-op Vital Signs: stable  Complications: No apparent anesthesia complications

## 2012-07-07 NOTE — H&P (View-Only) (Signed)
Patient ID: Manuel Lucas, male   DOB: 04/04/1956, 56 y.o.   MRN: [id number]  Transfered last PM for TURP today. Discussed again with pt risks/ benefits

## 2012-07-07 NOTE — Op Note (Signed)
Preoperative diagnosis: Urinary retention, acute renal failure secondary to obstructive uropathy, metastatic prostate cancer. Postoperative diagnosis: Same  Procedure: Channel TURP   Surgeon: Valetta Fuller M.D.  Anesthesia: Gen.  Indications: Patient presented with acute renal failure and inability to void. Patient's PSA was greater than 100. The patient bilateral hydronephrosis. Foley catheter insertion was difficult to down a large final urine was obtained. The patient's renal function improved with a creatinine returning to the 2.0-2.5 range. Patient was noted to have pelvic adenopathy. Biopsy confirmed metastatic cancer that appeared to be prostatic adenocarcinoma. The patient has metastatic prostate cancer. He is homeless gentleman who is currently managed with an indwelling Foley catheter. We feel he will need hormonal therapy. I suspect bilateral scrotal orchiectomy would be the most appropriate that the patient is not willing to accept that at this time. We will need to hopefully convince him to start chemical castration with Deborra Medina. In the interim we felt a limited channel TURP to try to give her the Foley catheter would be in his best interest.     Technique and findings: Patient was brought to the operating room. He received perioperative antibiotics. He had successful induction general anesthesia was placed in lithotomy position. PAS compression boots were utilized. Appropriate surgical timeout was performed. The patient's Foley catheter had been removed. [employer] sounds were used to dilate him to 43 [place]. A 26 French continuous resectoscope sheath was then inserted. Saline was used as irrigant and for the TURP wheeze gyrus instrumentation. The patient had an obstructed prostatic urethra. He was quite fixed consistent with locally advanced prostate cancer. There appeared to be nodular tumor growing up the trigone and identification of the ureteral orifices was difficult. We were able to  see some indigo carmine in the bladder generally but not identify either orifice specifically. The endoscopic findings again were consistent with a poorly differentiated locally advanced prostate cancer. A channel resection was performed limiting approximately 15-20 g of tissue. Visually things were fairly open at the completion of the procedure with good hemostasis. We placed a 20 French Foley catheter which drained blue urine/irrigant fluid. The patient was brought to recovery room in stable condition. We will plan on leaving the indwelling catheter for approximately 2 days and hopefully convince him to start chemical castration with hormonal therapy treatments.

## 2012-07-07 NOTE — Transfer of Care (Signed)
Immediate Anesthesia Transfer of Care Note  Patient: Manuel Lucas  Procedure(s) Performed: Procedure(s) (LRB): TRANSURETHRAL RESECTION OF THE PROSTATE (TURP) (N/A)  Patient Location: PACU  Anesthesia Type: General  Level of Consciousness: awake, alert , oriented and patient cooperative  Airway & Oxygen Therapy: Patient Spontanous Breathing and Patient connected to face mask oxygen  Post-op Assessment: Report given to PACU RN, Post -op Vital signs reviewed and stable and Patient moving all extremities  Post vital signs: Reviewed and stable  Complications: No apparent anesthesia complications

## 2012-07-07 NOTE — Progress Notes (Signed)
PT Cancellation Note  Treatment cancelled today due to patient receiving procedure or test.  Will check back as schedule permits.  Haily Caley,KATHrine E 07/07/2012, 11:53 AM Pager: [phone]

## 2012-07-07 NOTE — Progress Notes (Signed)
Patient ID: Manuel Lucas, male   DOB: 08/26/1956, 56 y.o.   MRN: [id number]  Transfered last PM for TURP today. Discussed again with pt risks/ benefits 

## 2012-07-07 NOTE — Interval H&P Note (Signed)
History and Physical Interval Note:  07/07/2012 12:09 PM  Manuel Lucas  has presented today for surgery, with the diagnosis of URINARY RETENTION, BILATERAL HYDRONEPHOSIS, PROBABLE CANCER  The various methods of treatment have been discussed with the patient and family. After consideration of risks, benefits and other options for treatment, the patient has consented to  Procedure(s) (LRB): TRANSURETHRAL RESECTION OF THE PROSTATE (TURP) (N/A) CYSTOSCOPY WITH RETROGRADE PYELOGRAM (Bilateral) as a surgical intervention .  The patient's history has been reviewed, patient examined, no change in status, stable for surgery.  I have reviewed the patient's chart and labs.  Questions were answered to the patient's satisfaction.     Naman Spychalski S

## 2012-07-07 NOTE — Progress Notes (Signed)
Patient ID: Manuel Lucas, male   DOB: 04/08/1956, 56 y.o.   MRN: [id number]  [facility] PROGRESS NOTE  Emoni Yang WUJ:[id number] DOB: 03-Mar-1956 DOA: 06/23/2012 PCP: Sheila Oats, MD  Brief narrative: Pt is 56 yo male with metastatic prostate cancer who was admitted 07/24 with acute urinary retention secondary to obstructive uropathy in the setting of prostate cancer and was also found to be in acute renal failure with Cr > 5 as a result. He is now s/p TURP done by Dr. Isabel Caprice (07/07/2012).  Principal Problem:  *Prostate carcinoma - metastatic and now status post TURP done today 08/07 by Dr. Isabel Caprice - will continue to monitor and follow up on recommendations  Active Problems:  Acute renal failure - creatinine now improving overall since admission - will continue gently hydration  - BMP in AM   Anemia - Hg up from yesterday - will obtain CBC in AM and will reassess if transfusion needed    Elevated blood pressure - BP remains stable and within target range - continue to monitor vitals per floor protocol   Obstructive uropathy - secondary to prostate carcinoma - status post TURP - foley in place with good urine output   Leukocytosis - WBC now within normal limits  - will obtain CBC in AM  Consultants:  Renal, Dr. Eliott Nine - will need follow up appointment in 2 weeks after the discharge from [facility] after TURP   Urology, Dr. Isabel Caprice   Psychiatry, Dr. Ferol Luz   Procedures and Radiological studies:  Renal ultrasound on 06/24/2012: Mild/moderate bilateral hydronephrosis. The urinary bladder is distended and there are bilateral ureteral jets. Findings raise concern for urinary bladder retention and/or bladder outlet obstruction.  Bone scan on 06/27/2012 : Negative bone scan for osseus metastatic disease  CT pelvis without contrast on 06/28/2012: Profound lymphadenopathy in the pelvis, most notably in the external iliac chains bilaterally, 4.4 X7 0.3 cm on  the right in 5.4 X4 0.3 cm on the left. Lymph nodes exert mass effect upon the adjacent vascular structures particularly on the right.  Renal ultrasound repeated on 07/01/2012: Unchanged mild to moderate bilateral symmetric hydronephrosis.  CT-guided biopsy of the iliac lymph node on 07/02/2012  Left Iliac Lymph Node Biopsy result:  By immunohistochemistry, the malignant cells are positive for cytokeratin AE1/AE3 and focally positive for PSA. They are negative for PSAP, CD45, Melan-A, and S100. Given the clinical scenario, the PSA positive staining is consistent with metastatic prostatic carcinoma.    Antibiotics:  Ciprofloxacin 07/07/2012 -->  Code Status: Full Family Communication: Pt at bedside Disposition Plan: Home when medically stable  HPI/Subjective: No events overnight. Pt denies any pain at this time.  Objective: Filed Vitals:   07/07/12 1325 07/07/12 1345 07/07/12 1400 07/07/12 1411  BP: 115/75 118/70 124/78 127/80  Pulse: 68 63 65 73  Temp: 97 F (36.1 C) 97.5 F (36.4 C)  97.6 F (36.4 C)  TempSrc:      Resp: 9 10 19 14   Height:      Weight:      SpO2: 100% 100% 100% 100%    Intake/Output Summary (Last 24 hours) at 07/07/12 2109 Last data filed at 07/07/12 1800  Gross per 24 hour  Intake 2880.33 ml  Output   2415 ml  Net 465.33 ml    Exam:   General:  Pt is alert, follows commands appropriately, not in acute distress  Cardiovascular: Regular rate and rhythm, S1/S2, no murmurs, no rubs, no gallops  Respiratory: Clear to auscultation  bilaterally, no wheezing, no crackles, no rhonchi  Abdomen: Soft, non tender, non distended, bowel sounds present, no guarding  Extremities: No edema, pulses DP and PT palpable bilaterally  Neuro: Grossly nonfocal  Data Reviewed: Basic Metabolic Panel:  Lab 07/06/12 1610 07/05/12 0525 07/04/12 0525 07/03/12 0535 07/02/12 0550  NA 134* 133* 133* 133* 138  K 5.0 5.1 4.5 4.4 4.1  CL 100 100 99 99 102  CO2 25 22 24  22 22   GLUCOSE 105* 107* 97 102* 106*  BUN 26* 27* 31* 37* 57*  CREATININE 2.27* 2.17* 2.31* 2.47* 3.41*  CALCIUM 8.9 9.1 8.9 9.2 9.3  MG -- -- -- -- --  PHOS 3.3 3.2 2.9 3.2 3.6   Liver Function Tests:  Lab 07/06/12 0555 07/05/12 0525 07/04/12 0525 07/03/12 0535 07/02/12 0550  AST -- -- -- -- --  ALT -- -- -- -- --  Jolly Mango -- -- -- -- --  BILITOT -- -- -- -- --  PROT -- -- -- -- --  ALBUMIN 2.6* 2.5* 2.5* 2.6* 2.7*   CBC:  Lab 07/07/12 1454 07/06/12 0555 07/05/12 0525 07/04/12 0525 07/02/12 0550 07/01/12 0824  WBC -- 8.7 9.5 10.4 10.3 15.5*  NEUTROABS -- -- -- -- -- 12.3*  HGB 8.7* 6.4* 7.4* 7.4* 7.4* --  HCT 25.3* 18.9* 21.9* 21.9* 21.2* --  MCV -- 81.1 81.4 81.1 79.1 77.3*  PLT -- 308 276 281 236 220    Recent Results (from the past 240 hour(s))  URINE CULTURE     Status: Normal   Collection Time   06/29/12  3:28 PM      Component Value Range Status Comment   Specimen Description URINE, CATHETERIZED   Final    Special Requests NONE   Final    Culture  Setup Time 06/29/2012 15:48   Final    Colony Count NO GROWTH   Final    Culture NO GROWTH   Final    Report Status 06/30/2012 FINAL   Final   CULTURE, BLOOD (ROUTINE X 2)     Status: Normal   Collection Time   06/29/12  3:32 PM      Component Value Range Status Comment   Specimen Description BLOOD LEFT ARM   Final    Special Requests BOTTLES DRAWN AEROBIC ONLY [facility]   Final    Culture  Setup Time 06/30/2012 00:49   Final    Culture NO GROWTH 5 DAYS   Final    Report Status 07/06/2012 FINAL   Final   CULTURE, BLOOD (ROUTINE X 2)     Status: Normal   Collection Time   06/29/12  3:35 PM      Component Value Range Status Comment   Specimen Description BLOOD LEFT ARM   Final    Special Requests BOTTLES DRAWN AEROBIC ONLY 5CC   Final    Culture  Setup Time 06/30/2012 00:48   Final    Culture NO GROWTH 5 DAYS   Final    Report Status 07/06/2012 FINAL   Final   SURGICAL PCR SCREEN     Status: Normal   Collection Time     07/07/12 10:51 AM      Component Value Range Status Comment   MRSA, PCR NEGATIVE  NEGATIVE Final    Staphylococcus aureus NEGATIVE  NEGATIVE Final      Scheduled Meds:   . amLODipine  10 mg Oral Daily  . calcitRIOL  0.25 mcg Oral QODAY  . ciprofloxacin  400  mg Intravenous 60 min Pre-Op  . ciprofloxacin  400 mg Intravenous Q12H  . darbepoetin (ARANESP) injection - NON-DIALYSIS  60 mcg Subcutaneous Q Wed-1800  . docusate sodium  100 mg Oral Daily  . pantoprazole  40 mg Oral BID AC  . sodium chloride  3 mL Intravenous Q12H   Continuous Infusions:   . sodium chloride 50 mL/hr at 07/07/12 0045  . dextrose 5 % and 0.45% NaCl 50 mL/hr at 07/07/12 1353  . lactated ringers    . lactated ringers       Debbora Presto, MD  [facility] Pager [phone]  If 7PM-7AM, please contact night-coverage [email or web address] Password [email or web address] 07/07/2012, 9:09 PM   LOS: 14 days

## 2012-07-07 NOTE — Anesthesia Preprocedure Evaluation (Addendum)
Anesthesia Evaluation  Patient identified by MRN, date of birth, ID band Patient awake    Reviewed: Allergy & Precautions, H&P , NPO status , Patient's Chart, lab work & pertinent test results  Airway Mallampati: II TM Distance: >3 FB Neck ROM: Full    Dental No notable dental hx. (+) Poor Dentition   Pulmonary neg pulmonary ROS,  breath sounds clear to auscultation  Pulmonary exam normal       Cardiovascular hypertension, negative cardio ROS  Rhythm:Regular Rate:Normal     Neuro/Psych negative neurological ROS  negative psych ROS   GI/Hepatic negative GI ROS, Neg liver ROS,   Endo/Other  negative endocrine ROS  Renal/GU Renal diseasenegative Renal ROS  negative genitourinary   Musculoskeletal negative musculoskeletal ROS (+)   Abdominal   Peds negative pediatric ROS (+)  Hematology negative hematology ROS (+)   Anesthesia Other Findings   Reproductive/Obstetrics negative OB ROS                           Anesthesia Physical Anesthesia Plan  ASA: II  Anesthesia Plan: General   Post-op Pain Management:    Induction: Intravenous  Airway Management Planned: LMA  Additional Equipment:   Intra-op Plan:   Post-operative Plan: Extubation in OR  Informed Consent: I have reviewed the patients History and Physical, chart, labs and discussed the procedure including the risks, benefits and alternatives for the proposed anesthesia with the patient or authorized representative who has indicated his/her understanding and acceptance.   Dental advisory given  Plan Discussed with: CRNA  Anesthesia Plan Comments:         Anesthesia Quick Evaluation

## 2012-07-07 NOTE — Preoperative (Signed)
Beta Blockers   Reason not to administer Beta Blockers:Not Applicable 

## 2012-07-08 ENCOUNTER — Encounter (HOSPITAL_COMMUNITY): Payer: Self-pay | Admitting: Urology

## 2012-07-08 LAB — CBC
Hemoglobin: 9 g/dL — ABNORMAL LOW (ref 13.0–17.0)
MCH: 27.4 pg (ref 26.0–34.0)
Platelets: 304 10*3/uL (ref 150–400)
RBC: 3.28 MIL/uL — ABNORMAL LOW (ref 4.22–5.81)
WBC: 8.1 10*3/uL (ref 4.0–10.5)

## 2012-07-08 LAB — TYPE AND SCREEN: Unit division: 0

## 2012-07-08 LAB — RENAL FUNCTION PANEL
CO2: 25 mEq/L (ref 19–32)
Calcium: 8.8 mg/dL (ref 8.4–10.5)
Chloride: 97 mEq/L (ref 96–112)
GFR calc Af Amer: 38 mL/min — ABNORMAL LOW (ref >90)
GFR calc non Af Amer: 33 mL/min — ABNORMAL LOW (ref >90)
Glucose, Bld: 111 mg/dL — ABNORMAL HIGH (ref 70–99)
Potassium: 4.8 mEq/L (ref 3.5–5.1)
Sodium: 130 mEq/L — ABNORMAL LOW (ref 135–145)

## 2012-07-08 MED ORDER — DEGARELIX ACETATE 120 MG ~~LOC~~ SOLR
240.0000 mg | Freq: Once | SUBCUTANEOUS | Status: AC
  Administered 2012-07-09: 240 mg via SUBCUTANEOUS
  Filled 2012-07-08: qty 6

## 2012-07-08 NOTE — Progress Notes (Signed)
Patient ID: Manuel Lucas, male   DOB: 1956-02-02, 56 y.o.   MRN: [id number] 1 Day Post-Op Subjective: Patient reports no new issues at this time. Foley catheter is draining well and urine is light pain. Hemoglobin actually increased this morning. Creatinine down to 2.1.  Objective: Vital signs in last 24 hours: Temp:  [97 F (36.1 C)-98.8 F (37.1 C)] 97.7 F (36.5 C) (08/08 0649) Pulse Rate:  [63-90] 81  (08/08 0649) Resp:  [9-20] 16  (08/08 0649) BP: (97-127)/(66-80) 102/68 mmHg (08/08 0649) SpO2:  [94 %-100 %] 94 % (08/08 0649) Weight:  [76.4 kg (168 lb 6.9 oz)] 76.4 kg (168 lb 6.9 oz) (08/08 0500)  Intake/Output from previous day: 08/07 0701 - 08/08 0700 In: 2617.8 [P.O.:300; I.V.:2105.8; Blood:212] Out: 3340 [Urine:3340] Intake/Output this shift:    Physical Exam:  Constitutional: Vital signs reviewed. WD WN in NAD   Eyes: PERRL, No scleral icterus.   Cardiovascular: RRR Pulmonary/Chest: Normal effort Abdominal: Soft. Non-tender, non-distended, bowel sounds are normal, no masses, organomegaly, or guarding present.  Genitourinary: Indwelling Foley catheter. Extremities: No cyanosis or edema   Lab Results:  Basename 07/08/12 0520 07/07/12 1454 07/06/12 0555  HGB 9.0* 8.7* 6.4*  HCT 26.4* 25.3* 18.9*   BMET  Basename 07/08/12 0520 07/06/12 0555  NA 130* 134*  K 4.8 5.0  CL 97 100  CO2 25 25  GLUCOSE 111* 105*  BUN 23 26*  CREATININE 2.11* 2.27*  CALCIUM 8.8 8.9   No results found for this basename: LABPT:3,INR:3 in the last 72 hours No results found for this basename: LABURIN:1 in the last 72 hours Results for orders placed during the hospital encounter of 06/23/12  URINE CULTURE     Status: Normal   Collection Time   06/29/12  3:28 PM      Component Value Range Status Comment   Specimen Description URINE, CATHETERIZED   Final    Special Requests NONE   Final    Culture  Setup Time 06/29/2012 15:48   Final    Colony Count NO GROWTH   Final    Culture NO  GROWTH   Final    Report Status 06/30/2012 FINAL   Final   CULTURE, BLOOD (ROUTINE X 2)     Status: Normal   Collection Time   06/29/12  3:32 PM      Component Value Range Status Comment   Specimen Description BLOOD LEFT ARM   Final    Special Requests BOTTLES DRAWN AEROBIC ONLY [facility]   Final    Culture  Setup Time 06/30/2012 00:49   Final    Culture NO GROWTH 5 DAYS   Final    Report Status 07/06/2012 FINAL   Final   CULTURE, BLOOD (ROUTINE X 2)     Status: Normal   Collection Time   06/29/12  3:35 PM      Component Value Range Status Comment   Specimen Description BLOOD LEFT ARM   Final    Special Requests BOTTLES DRAWN AEROBIC ONLY 5[facility]   Final    Culture  Setup Time 06/30/2012 00:48   Final    Culture NO GROWTH 5 DAYS   Final    Report Status 07/06/2012 FINAL   Final   SURGICAL PCR SCREEN     Status: Normal   Collection Time   07/07/12 10:51 AM      Component Value Range Status Comment   MRSA, PCR NEGATIVE  NEGATIVE Final    Staphylococcus aureus NEGATIVE  NEGATIVE  Final     Studies/Results: No results found.  Assessment/Plan:   Metastatic adenocarcinoma the prostate. Patient continues to have a poor understanding of his disease. Treatment of choice at this time would be chemical castration. We will order Deborra Medina  To be given today if possible. I will plan on removal his Foley tomorrow morning with a voiding trial. The patient then hopefully should be ready for discharge.  We will need to work with the cancer center to provide ongoing treatment for Manuel Lucas in that he is uninsured and will require potentially expensive medications.   LOS: 15 days   Kirubel Aja S 07/08/2012, 7:50 AM

## 2012-07-08 NOTE — Progress Notes (Signed)
Patient ID: Manuel Lucas, male   DOB: July 08, 1956, 56 y.o.   MRN: [id number]  [facility] PROGRESS NOTE  [name] [id number]:[id number] DOB: 1956/12/01 DOA: 06/23/2012 PCP: Sheila Oats, MD  Brief narrative:  Pt is 56 yo male with metastatic prostate cancer who was admitted 07/24 with acute urinary retention secondary to obstructive uropathy in the setting of prostate cancer and was also found to be in acute renal failure with Cr > 5 as a result. He is now s/p TURP done by Dr. Isabel Caprice (07/07/2012).   Principal Problem:  *Prostate carcinoma  - metastatic and now status post TURP done t08/07 by Dr. Isabel Caprice  - will continue to monitor and follow up on recommendations   Active Problems:  Acute renal failure  - creatinine now improving overall since admission  - will continue gently hydration  - BMP in AM   Anemia  - Hg up from yesterday  - will obtain CBC in AM and will reassess if transfusion needed   Elevated blood pressure  - BP remains stable and within target range  - continue to monitor vitals per floor protocol   Obstructive uropathy  - secondary to prostate carcinoma  - status post TURP  - foley in place with good urine output  - plan to d/c foley today or in AM  Leukocytosis  - WBC now within normal limits  - will obtain CBC in AM   Consultants:  Renal, Dr. Eliott Nine - will need follow up appointment in 2 weeks after the discharge from [facility] after TURP  Urology, Dr. Isabel Caprice  Psychiatry, Dr. Ferol Luz   Procedures and Radiological studies:  Renal ultrasound on 06/24/2012: Mild/moderate bilateral hydronephrosis. The urinary bladder is distended and there are bilateral ureteral jets. Findings raise concern for urinary bladder retention and/or bladder outlet obstruction.  Bone scan on 06/27/2012 : Negative bone scan for osseus metastatic disease  CT pelvis without contrast on 06/28/2012: Profound lymphadenopathy in the pelvis, most notably in the external  iliac chains bilaterally, 4.4 X7 0.3 cm on the right in 5.4 X4 0.3 cm on the left. Lymph nodes exert mass effect upon the adjacent vascular structures particularly on the right.  Renal ultrasound repeated on 07/01/2012: Unchanged mild to moderate bilateral symmetric hydronephrosis.  CT-guided biopsy of the iliac lymph node on 07/02/2012  Left Iliac Lymph Node Biopsy result:  By immunohistochemistry, the malignant cells are positive for cytokeratin AE1/AE3 and focally positive for PSA. They are negative for PSAP, CD45, Melan-A, and S100. Given the clinical scenario, the PSA positive staining is consistent with metastatic prostatic carcinoma.   Antibiotics:  Ciprofloxacin 07/07/2012 -->  Code Status: Full  Family Communication: Pt at bedside  Disposition Plan: SNF when medically stable   HPI/Subjective: No events overnight.   Objective: Filed Vitals:   07/08/12 0500 07/08/12 0649 07/08/12 0915 07/08/12 1348  BP:  102/68 107/74 100/66  Pulse:  81 86 102  Temp:  97.7 F (36.5 C) 97.8 F (36.6 C) 98.2 F (36.8 C)  TempSrc:  Oral Oral Oral  Resp:  16 18 18   Height:      Weight: 168 lb 6.9 oz (76.4 kg)     SpO2:  94% 98% 99%    Intake/Output Summary (Last 24 hours) at 07/08/12 1640 Last data filed at 07/08/12 1300  Gross per 24 hour  Intake 2691.66 ml  Output   3511 ml  Net -819.34 ml    Exam:   General:  Pt is alert, follows commands appropriately,  not in acute distress, chronically ill appearing  Cardiovascular: Regular rate and rhythm, S1/S2, no murmurs, no rubs, no gallops  Respiratory: Clear to auscultation bilaterally, no wheezing, no crackles, no rhonchi  Abdomen: Soft, non tender, non distended, bowel sounds present, no guarding  Extremities: No edema, pulses DP and PT palpable bilaterally  Neuro: Grossly nonfocal  Data Reviewed: Basic Metabolic Panel:  Lab 07/08/12 1610 07/06/12 0555 07/05/12 0525 07/04/12 0525 07/03/12 0535  NA 130* 134* 133* 133* 133*    K 4.8 5.0 5.1 4.5 4.4  CL 97 100 100 99 99  CO2 25 25 22 24 22   GLUCOSE 111* 105* 107* 97 102*  BUN 23 26* 27* 31* 37*  CREATININE 2.11* 2.27* 2.17* 2.31* 2.47*  CALCIUM 8.8 8.9 9.1 8.9 9.2  MG -- -- -- -- --  PHOS 3.9 3.3 3.2 2.9 3.2   Liver Function Tests:  Lab 07/08/12 0520 07/06/12 0555 07/05/12 0525 07/04/12 0525 07/03/12 0535  AST -- -- -- -- --  ALT -- -- -- -- --  Jolly Mango -- -- -- -- --  BILITOT -- -- -- -- --  PROT -- -- -- -- --  ALBUMIN 2.5* 2.6* 2.5* 2.5* 2.6*   CBC:  Lab 07/08/12 0520 07/07/12 1454 07/06/12 0555 07/05/12 0525 07/04/12 0525 07/02/12 0550  WBC 8.1 -- 8.7 9.5 10.4 10.3  NEUTROABS -- -- -- -- -- --  HGB 9.0* 8.7* 6.4* 7.4* 7.4* --  HCT 26.4* 25.3* 18.9* 21.9* 21.9* --  MCV 80.5 -- 81.1 81.4 81.1 79.1  PLT 304 -- 308 276 281 236     Recent Results (from the past 240 hour(s))  URINE CULTURE     Status: Normal   Collection Time   06/29/12  3:28 PM      Component Value Range Status Comment   Specimen Description URINE, CATHETERIZED   Final    Special Requests NONE   Final    Culture  Setup Time 06/29/2012 15:48   Final    Colony Count NO GROWTH   Final    Culture NO GROWTH   Final    Report Status 06/30/2012 FINAL   Final   CULTURE, BLOOD (ROUTINE X 2)     Status: Normal   Collection Time   06/29/12  3:32 PM      Component Value Range Status Comment   Specimen Description BLOOD LEFT ARM   Final    Special Requests BOTTLES DRAWN AEROBIC ONLY [facility]   Final    Culture  Setup Time 06/30/2012 00:49   Final    Culture NO GROWTH 5 DAYS   Final    Report Status 07/06/2012 FINAL   Final   CULTURE, BLOOD (ROUTINE X 2)     Status: Normal   Collection Time   06/29/12  3:35 PM      Component Value Range Status Comment   Specimen Description BLOOD LEFT ARM   Final    Special Requests BOTTLES DRAWN AEROBIC ONLY [facility]   Final    Culture  Setup Time 06/30/2012 00:48   Final    Culture NO GROWTH 5 DAYS   Final    Report Status 07/06/2012 FINAL   Final    SURGICAL PCR SCREEN     Status: Normal   Collection Time   07/07/12 10:51 AM      Component Value Range Status Comment   MRSA, PCR NEGATIVE  NEGATIVE Final    Staphylococcus aureus NEGATIVE  NEGATIVE Final  Scheduled Meds:   . amLODipine  10 mg Oral Daily  . calcitRIOL  0.25 mcg Oral QODAY  . ciprofloxacin  400 mg Intravenous Q12H  . darbepoetin (ARANESP) injection - NON-DIALYSIS  60 mcg Subcutaneous Q Wed-1800  . degarelix  240 mg Subcutaneous Once  . docusate sodium  100 mg Oral Daily  . pantoprazole  40 mg Oral BID AC  . sodium chloride  3 mL Intravenous Q12H   Continuous Infusions:   . sodium chloride 50 mL/hr at 07/07/12 0045  . dextrose 5 % and 0.45% NaCl 50 mL/hr at 07/07/12 1353  . lactated ringers    . lactated ringers       Debbora Presto, MD  [facility] Pager [phone]  If 7PM-7AM, please contact night-coverage [email or web address] Password [email or web address] 07/08/2012, 4:40 PM   LOS: 15 days

## 2012-07-08 NOTE — Progress Notes (Signed)
Pt states he was able to void "good". Per NT, Pt voided 75ml. Pt is drinking water and states that he is "okay" and is finally "getting things going". Informed pt of his need to void more than that due to his intake with po and IV liquids, so we will bladder scan him shortly and perform In and out cath if needed. Pt states that they did that earlier and he is "going good... I don't want to inflict pain. I will let you do the scan thing but that's it. Just let God do his work." I verified with the pt that he was refusing to do the in and out cath and he replied yes. I attempted to educate the client on the importance of preventing his bladder from getting too full and he waved me away. He states, "Just let me drink." I will follow up with the pt, continue to attempt to educate, and monitor urinary output.

## 2012-07-08 NOTE — Progress Notes (Signed)
Patient able to void 50 ml bloody urine twice, bladder scan performed showing 415 ml urine in the bladder. I&O cath done @1600  with 600 ml bloody/clear urine out. Patient reports that he feels better now, will continue to monitor.

## 2012-07-09 LAB — CBC
MCV: 80.6 fL (ref 78.0–100.0)
Platelets: 345 10*3/uL (ref 150–400)
RDW: 14.9 % (ref 11.5–15.5)
WBC: 11.8 10*3/uL — ABNORMAL HIGH (ref 4.0–10.5)

## 2012-07-09 LAB — BASIC METABOLIC PANEL
Calcium: 9 mg/dL (ref 8.4–10.5)
Chloride: 94 mEq/L — ABNORMAL LOW (ref 96–112)
Creatinine, Ser: 2.94 mg/dL — ABNORMAL HIGH (ref 0.50–1.35)
GFR calc Af Amer: 26 mL/min — ABNORMAL LOW (ref >90)
Sodium: 127 mEq/L — ABNORMAL LOW (ref 135–145)

## 2012-07-09 MED ORDER — LIDOCAINE HCL 2 % EX GEL
CUTANEOUS | Status: DC | PRN
  Filled 2012-07-09: qty 5

## 2012-07-09 MED ORDER — LIDOCAINE HCL 2 % EX GEL
Freq: Once | CUTANEOUS | Status: DC
  Filled 2012-07-09: qty 5

## 2012-07-09 MED ORDER — HYDROMORPHONE HCL PF 1 MG/ML IJ SOLN
1.0000 mg | INTRAMUSCULAR | Status: DC | PRN
  Administered 2012-07-09 – 2012-07-11 (×5): 1 mg via INTRAVENOUS
  Filled 2012-07-09 (×5): qty 1

## 2012-07-09 NOTE — Progress Notes (Signed)
Patient ID: Manuel Lucas, male   DOB: 1956/08/22, 56 y.o.   MRN: [id number]  [facility] PROGRESS NOTE  Samul Mcinroy ZHY:[id number] DOB: 1956-05-27 DOA: 06/23/2012 PCP: Sheila Oats, MD  Brief narrative:  Pt is 56 yo male with metastatic prostate cancer who was admitted 07/24 with acute urinary retention secondary to obstructive uropathy in the setting of prostate cancer and was also found to be in acute renal failure with Cr > 5 as a result. He is now s/p TURP done by Dr. Isabel Caprice (07/07/2012).   Principal Problem:  *Prostate carcinoma  - metastatic and now status post TURP done t08/07 by Dr. Isabel Caprice  - will continue to monitor and follow up on recommendations   Active Problems:  Acute renal failure  - creatinine now up from yesterday and likely secondary to component of obstruction - will continue gently hydration  - condom cath as needed and bladder scan, pt has minimal urine output - BMP in AM   Anemia  - Hg up from yesterday  - will obtain CBC in AM and will reassess if transfusion needed   Elevated blood pressure  - BP remains stable and within target range  - continue to monitor vitals per floor protocol   Obstructive uropathy  - secondary to prostate carcinoma  - status post TURP  - condom cath as needed and bladder scan to evaluate amount of fluid retained  Leukocytosis  - WBC now up from yesterday - will obtain CBC in AM   Consultants:  Renal, Dr. Eliott Nine - will need follow up appointment in 2 weeks after the discharge from [facility] after TURP  Urology, Dr. Isabel Caprice  Psychiatry, Dr. Ferol Luz   Procedures and Radiological studies:  Renal ultrasound on 06/24/2012: Mild/moderate bilateral hydronephrosis. The urinary bladder is distended and there are bilateral ureteral jets. Findings raise concern for urinary bladder retention and/or bladder outlet obstruction.  Bone scan on 06/27/2012 : Negative bone scan for osseus metastatic disease  CT pelvis  without contrast on 06/28/2012: Profound lymphadenopathy in the pelvis, most notably in the external iliac chains bilaterally, 4.4 X7 0.3 cm on the right in 5.4 X4 0.3 cm on the left. Lymph nodes exert mass effect upon the adjacent vascular structures particularly on the right.  Renal ultrasound repeated on 07/01/2012: Unchanged mild to moderate bilateral symmetric hydronephrosis.  CT-guided biopsy of the iliac lymph node on 07/02/2012  Left Iliac Lymph Node Biopsy result:  By immunohistochemistry, the malignant cells are positive for cytokeratin AE1/AE3 and focally positive for PSA. They are negative for PSAP, CD45, Melan-A, and S100. Given the clinical scenario, the PSA positive staining is consistent with metastatic prostatic carcinoma.   Antibiotics:  Ciprofloxacin 07/07/2012 -->  Code Status: Full  Family Communication: Pt at bedside  Disposition Plan: SNF when medically stable    HPI/Subjective: No events overnight. Pt upset that foley removed sooner than required and reports pain with cath.  Objective: Filed Vitals:   07/08/12 1348 07/08/12 2058 07/09/12 0519 07/09/12 1449  BP: 100/66 115/72 103/70 127/81  Pulse: 102 95 65 91  Temp: 98.2 F (36.8 C) 97.7 F (36.5 C) 98.6 F (37 C) 97.6 F (36.4 C)  TempSrc: Oral Oral Oral Oral  Resp: 18 18 18 20   Height:      Weight:      SpO2: 99% 99% 100% 100%    Intake/Output Summary (Last 24 hours) at 07/09/12 1650 Last data filed at 07/09/12 1600  Gross per 24 hour  Intake 2561.67 ml  Output   2300 ml  Net 261.67 ml    Exam:   General:  Pt is alert, follows commands appropriately, in mild distress due to pain  Cardiovascular: Regular rate and rhythm, S1/S2, no murmurs, no rubs, no gallops  Respiratory: Clear to auscultation bilaterally, no wheezing, no crackles, no rhonchi  Abdomen: Soft, non tender, non distended, bowel sounds present, no guarding  Extremities: No edema, pulses DP and PT palpable bilaterally  Neuro:  Grossly nonfocal  Data Reviewed: Basic Metabolic Panel:  Lab 07/09/12 1610 07/08/12 0520 07/06/12 0555 07/05/12 0525 07/04/12 0525 07/03/12 0535  NA 127* 130* 134* 133* 133* --  K 4.8 4.8 5.0 5.1 4.5 --  CL 94* 97 100 100 99 --  CO2 23 25 25 22 24  --  GLUCOSE 122* 111* 105* 107* 97 --  BUN 31* 23 26* 27* 31* --  CREATININE 2.94* 2.11* 2.27* 2.17* 2.31* --  CALCIUM 9.0 8.8 8.9 9.1 8.9 --  MG -- -- -- -- -- --  PHOS -- 3.9 3.3 3.2 2.9 3.2   Liver Function Tests:  Lab 07/08/12 0520 07/06/12 0555 07/05/12 0525 07/04/12 0525 07/03/12 0535  AST -- -- -- -- --  ALT -- -- -- -- --  Jolly Mango -- -- -- -- --  BILITOT -- -- -- -- --  PROT -- -- -- -- --  ALBUMIN 2.5* 2.6* 2.5* 2.5* 2.6*   CBC:  Lab 07/09/12 0449 07/08/12 0520 07/07/12 1454 07/06/12 0555 07/05/12 0525 07/04/12 0525  WBC 11.8* 8.1 -- 8.7 9.5 10.4  NEUTROABS -- -- -- -- -- --  HGB 9.3* 9.0* 8.7* 6.4* 7.4* --  HCT 27.0* 26.4* 25.3* 18.9* 21.9* --  MCV 80.6 80.5 -- 81.1 81.4 81.1  PLT 345 304 -- 308 276 281     Recent Results (from the past 240 hour(s))  SURGICAL PCR SCREEN     Status: Normal   Collection Time   07/07/12 10:51 AM      Component Value Range Status Comment   MRSA, PCR NEGATIVE  NEGATIVE Final    Staphylococcus aureus NEGATIVE  NEGATIVE Final      Scheduled Meds:   . amLODipine  10 mg Oral Daily  . calcitRIOL  0.25 mcg Oral QODAY  . ciprofloxacin  400 mg Intravenous Q12H  . darbepoetin (ARANESP) injection - NON-DIALYSIS  60 mcg Subcutaneous Q Wed-1800  . degarelix  240 mg Subcutaneous Once  . docusate sodium  100 mg Oral Daily  . lidocaine   Urethral Once  . pantoprazole  40 mg Oral BID AC  . sodium chloride  3 mL Intravenous Q12H   Continuous Infusions:   . sodium chloride 50 mL/hr at 07/07/12 0045  . dextrose 5 % and 0.45% NaCl 50 mL/hr at 07/07/12 1353  . lactated ringers    . lactated ringers       Debbora Presto, MD  [facility] Pager [phone]  If 7PM-7AM,  please contact night-coverage [email or web address] Password [email or web address] 07/09/2012, 4:50 PM   LOS: 16 days

## 2012-07-09 NOTE — Progress Notes (Signed)
Patient ID: Manuel Lucas, male   DOB: 09/25/1956, 56 y.o.   MRN: [id number] 2 Days Post-Op Subjective: Patient had his Foley cath removed yesterday despite the order for it not be removed until today. The patient has not been able to void spontaneously and apparently had an in out cath this morning for 900 cc. Urine is bloody but there did not appear to be a large amount of clots. Patient's creatinine did increase to out presumptively due to some recurrent retention once the Foley was removed. Hemoglobin continues to actually slowly increase. The pharmacy has been able to obtain [place] which will be administered this morning.  Objective: Vital signs in last 24 hours: Temp:  [97.7 F (36.5 C)-98.6 F (37 C)] 98.6 F (37 C) (08/09 0519) Pulse Rate:  [65-102] 65  (08/09 0519) Resp:  [18] 18  (08/09 0519) BP: (100-115)/(66-74) 103/70 mmHg (08/09 0519) SpO2:  [98 %-100 %] 100 % (08/09 0519)  Intake/Output from previous day: 08/08 0701 - 08/09 0700 In: 3861.7 [P.O.:1620; I.V.:1841.7; IV Piggyback:400] Out: 2162 [Urine:2160; Stool:2] Intake/Output this shift:    Physical Exam:  Constitutional: Vital signs reviewed. WD WN in NAD   Eyes: PERRL, No scleral icterus.   Cardiovascular: RRR Pulmonary/Chest: Normal effort Abdominal: Soft. Non-tender, non-distended, bowel sounds are normal, no masses, organomegaly, or guarding present.  Genitourinary: No change Extremities: No cyanosis or edema   Lab Results:  Basename 07/09/12 0449 07/08/12 0520 07/07/12 1454  HGB 9.3* 9.0* 8.7*  HCT 27.0* 26.4* 25.3*   BMET  Basename 07/09/12 0449 07/08/12 0520  NA 127* 130*  K 4.8 4.8  CL 94* 97  CO2 23 25  GLUCOSE 122* 111*  BUN 31* 23  CREATININE 2.94* 2.11*  CALCIUM 9.0 8.8   No results found for this basename: LABPT:3,INR:3 in the last 72 hours No results found for this basename: LABURIN:1 in the last 72 hours Results for orders placed during the hospital encounter of 06/23/12  URINE  CULTURE     Status: Normal   Collection Time   06/29/12  3:28 PM      Component Value Range Status Comment   Specimen Description URINE, CATHETERIZED   Final    Special Requests NONE   Final    Culture  Setup Time 06/29/2012 15:48   Final    Colony Count NO GROWTH   Final    Culture NO GROWTH   Final    Report Status 06/30/2012 FINAL   Final   CULTURE, BLOOD (ROUTINE X 2)     Status: Normal   Collection Time   06/29/12  3:32 PM      Component Value Range Status Comment   Specimen Description BLOOD LEFT ARM   Final    Special Requests BOTTLES DRAWN AEROBIC ONLY [facility]   Final    Culture  Setup Time 06/30/2012 00:49   Final    Culture NO GROWTH 5 DAYS   Final    Report Status 07/06/2012 FINAL   Final   CULTURE, BLOOD (ROUTINE X 2)     Status: Normal   Collection Time   06/29/12  3:35 PM      Component Value Range Status Comment   Specimen Description BLOOD LEFT ARM   Final    Special Requests BOTTLES DRAWN AEROBIC ONLY 5CC   Final    Culture  Setup Time 06/30/2012 00:48   Final    Culture NO GROWTH 5 DAYS   Final    Report Status 07/06/2012 FINAL  Final   SURGICAL PCR SCREEN     Status: Normal   Collection Time   07/07/12 10:51 AM      Component Value Range Status Comment   MRSA, PCR NEGATIVE  NEGATIVE Final    Staphylococcus aureus NEGATIVE  NEGATIVE Final     Studies/Results: No results found.  Assessment/Plan:   Metastatic prostate cancer with obstructive uropathy and renal failure. Patient's creatinine had been improving until today. Again I suspect this is secondary to recurrent urinary retention. On looking into all the patient's Foley catheter was removed today early. At this point I  recommend continuing with intermittent catheterization the next 24-48 hours. If the patient continues to be unable to void that I think a Foley catheter should be reinserted and he can go temporarily to a facility/nursing home with a Foley catheter with attempts at removing it for another voiding  trial 3-4 weeks down the road. I will be gone next week and therefore will discuss Manuel Lucas case with the on-call physician for this weekend.   LOS: 16 days   Federick Levene S 07/09/2012, 7:56 AM

## 2012-07-09 NOTE — Progress Notes (Signed)
Pt unable to void all day, bladder scan revealed greater than 950cc of urine present.  Catheterized pt and 1100 cc of urine was returned.  Dr. Isabel Caprice notified.  Will continue to monitor.

## 2012-07-09 NOTE — Progress Notes (Signed)
PT Cancellation Note  Treatment cancelled today due to pt reports tired from getting cleaned up earlier and asked PT to come back at another time.  MD in to see pt upon leaving room.  Will check back as schedule permits.  Troy Hartzog,KATHrine E 07/09/2012, 1:32 PM Pager: [phone]

## 2012-07-09 NOTE — Progress Notes (Addendum)
Bladder scan=796 ml. Pt verbalizes pelvic discomfort. Pt agrees to allow in and out cath. In and out cath completed using aseptic technique. Pt tolerated with moderate complaints. 925 ml bloody urine drained with minimal amt of small red blood clots. Pt verbalizes relief of pelvic discomfort. Will continue to monitor.

## 2012-07-10 LAB — BASIC METABOLIC PANEL
Chloride: 96 mEq/L (ref 96–112)
GFR calc Af Amer: 31 mL/min — ABNORMAL LOW (ref >90)
Potassium: 4.6 mEq/L (ref 3.5–5.1)

## 2012-07-10 LAB — CBC
HCT: 24.2 % — ABNORMAL LOW (ref 39.0–52.0)
Platelets: 271 10*3/uL (ref 150–400)
RBC: 3.04 MIL/uL — ABNORMAL LOW (ref 4.22–5.81)
RDW: 15.1 % (ref 11.5–15.5)
WBC: 8.6 10*3/uL (ref 4.0–10.5)

## 2012-07-10 NOTE — Progress Notes (Signed)
Patient ID: Manuel Lucas, male   DOB: 1956/02/25, 56 y.o.   MRN: [id number]  [facility] PROGRESS NOTE  Manuel Lucas [id number] DOB: [birth year] DOA: 06/23/2012 PCP: Sheila Oats, MD  Brief narrative:  Pt is 56 yo male with metastatic prostate cancer who was admitted 07/24 with acute urinary retention secondary to obstructive uropathy in the setting of prostate cancer and was also found to be in acute renal failure with Cr > 5 as a result. He is now s/p TURP done by Dr. Isabel Caprice (07/07/2012).   Principal Problem:  *Prostate carcinoma  - metastatic and now status post TURP done 08/07 by Dr. Isabel Caprice  - will continue to monitor and follow up on recommendations  - Foley placed back as pt unable to void - trial of void will be attempted in few days  Active Problems:  Acute renal failure  - creatinine trending down - will continue gently hydration  - Foley in Place with no plan of removal for next few days - BMP in AM   Anemia  - Hg slightly down from yesterday - will obtain CBC in AM and will reassess if transfusion needed   Elevated blood pressure  - BP remains stable and within target range  - continue to monitor vitals per floor protocol   Obstructive uropathy  - secondary to prostate carcinoma  - status post TURP  - Foley in place  Leukocytosis  - WBC WNL today - will obtain CBC in AM   Consultants:  Renal, Dr. Eliott Nine - will need follow up appointment in 2 weeks after the discharge from [facility] after TURP  Urology, Dr. Isabel Caprice  Psychiatry, Dr. Ferol Luz   Procedures and Radiological studies:  Renal ultrasound on 06/24/2012: Mild/moderate bilateral hydronephrosis. The urinary bladder is distended and there are bilateral ureteral jets. Findings raise concern for urinary bladder retention and/or bladder outlet obstruction.  Bone scan on 06/27/2012 : Negative bone scan for osseus metastatic disease  CT pelvis without contrast on 06/28/2012: Profound  lymphadenopathy in the pelvis, most notably in the external iliac chains bilaterally, 4.4 X7 0.3 cm on the right in 5.4 X4 0.3 cm on the left. Lymph nodes exert mass effect upon the adjacent vascular structures particularly on the right.  Renal ultrasound repeated on 07/01/2012: Unchanged mild to moderate bilateral symmetric hydronephrosis.  CT-guided biopsy of the iliac lymph node on 07/02/2012  Left Iliac Lymph Node Biopsy result:  By immunohistochemistry, the malignant cells are positive for cytokeratin AE1/AE3 and focally positive for PSA. They are negative for PSAP, CD45, Melan-A, and S100. Given the clinical scenario, the PSA positive staining is consistent with metastatic prostatic carcinoma.   Antibiotics:  Ciprofloxacin 07/07/2012 -->  Code Status: Full  Family Communication: Pt at bedside  Disposition Plan: SNF when medically stable   HPI/Subjective: No events overnight.   Objective: Filed Vitals:   07/09/12 0519 07/09/12 1449 07/09/12 2225 07/10/12 0535  BP: 103/70 127/81 126/80 117/72  Pulse: 65 91 86 85  Temp: 98.6 F (37 C) 97.6 F (36.4 C) 98.8 F (37.1 C) 99 F (37.2 C)  TempSrc: Oral Oral Oral Oral  Resp: 18 20 16 16   Height:      Weight:      SpO2: 100% 100% 100% 96%    Intake/Output Summary (Last 24 hours) at 07/10/12 1116 Last data filed at 07/10/12 0806  Gross per 24 hour  Intake   1323 ml  Output   3500 ml  Net  -2177 ml  Exam:   General:  Pt is tired and refuses physical exam this AM  Data Reviewed: Basic Metabolic Panel:  Lab 07/10/12 0102 07/09/12 0449 07/08/12 0520 07/06/12 0555 07/05/12 0525 07/04/12 0525  NA 128* 127* 130* 134* 133* --  K 4.6 4.8 4.8 5.0 5.1 --  CL 96 94* 97 100 100 --  CO2 22 23 25 25 22  --  GLUCOSE 97 122* 111* 105* 107* --  BUN 29* 31* 23 26* 27* --  CREATININE 2.48* 2.94* 2.11* 2.27* 2.17* --  CALCIUM 8.4 9.0 8.8 8.9 9.1 --  MG -- -- -- -- -- --  PHOS -- -- 3.9 3.3 3.2 2.9   Liver Function Tests:  Lab  07/08/12 0520 07/06/12 0555 07/05/12 0525 07/04/12 0525  AST -- -- -- --  ALT -- -- -- --  ALKPHOS -- -- -- --  BILITOT -- -- -- --  PROT -- -- -- --  ALBUMIN 2.5* 2.6* 2.5* 2.5*   CBC:  Lab 07/10/12 0454 07/09/12 0449 07/08/12 0520 07/07/12 1454 07/06/12 0555 07/05/12 0525  WBC 8.6 11.8* 8.1 -- 8.7 9.5  NEUTROABS -- -- -- -- -- --  HGB 8.4* 9.3* 9.0* 8.7* 6.4* --  HCT 24.2* 27.0* 26.4* 25.3* 18.9* --  MCV 79.6 80.6 80.5 -- 81.1 81.4  PLT 271 345 304 -- 308 276    Recent Results (from the past 240 hour(s))  SURGICAL PCR SCREEN     Status: Normal   Collection Time   07/07/12 10:51 AM      Component Value Range Status Comment   MRSA, PCR NEGATIVE  NEGATIVE Final    Staphylococcus aureus NEGATIVE  NEGATIVE Final      Scheduled Meds:   . amLODipine  10 mg Oral Daily  . calcitRIOL  0.25 mcg Oral QODAY  . ciprofloxacin  400 mg Intravenous Q12H  . darbepoetin (ARANESP) injection - NON-DIALYSIS  60 mcg Subcutaneous Q Wed-1800  . docusate sodium  100 mg Oral Daily  . lidocaine   Urethral Once  . pantoprazole  40 mg Oral BID AC  . sodium chloride  3 mL Intravenous Q12H   Continuous Infusions:   . sodium chloride 50 mL/hr at 07/07/12 0045  . dextrose 5 % and 0.45% NaCl 50 mL/hr at 07/10/12 0705  . lactated ringers    . lactated ringers       Debbora Presto, MD  [facility] Pager [phone]  If 7PM-7AM, please contact night-coverage [email or web address] Password [email or web address] 07/10/2012, 11:16 AM   LOS: 17 days

## 2012-07-10 NOTE — Progress Notes (Signed)
3 Days Post-Op  Subjective:  1 - Metastatic Prostate Cancer - new diagnosis, biopsy-proven from pelvic lymph node. Initial PSA >100. Now s/p Firmagon injection for androgen deprivation.  2 - Obstructive Uropathy - s/p channel TURP 8/7. Failed initial trial of void, now with foley replaced w/o problems. Initial Cr high 5's, now stable in mid 2's.  Today Manuel Lucas is w/o complaints. NO fevers, pain, catheter problems. Received Firmagon yesterday.  Objective: Vital signs in last 24 hours: Temp:  [97.6 F (36.4 C)-99 F (37.2 C)] 99 F (37.2 C) (08/10 0535) Pulse Rate:  [85-91] 85  (08/10 0535) Resp:  [16-20] 16  (08/10 0535) BP: (117-127)/(72-81) 117/72 mmHg (08/10 0535) SpO2:  [96 %-100 %] 96 % (08/10 0535) Last BM Date: 07/08/12  Intake/Output from previous day: 08/09 0701 - 08/10 0700 In: 1323 [P.O.:720; I.V.:603] Out: 3550 [Urine:3550] Intake/Output this shift:    General appearance: alert and cooperative Male genitalia: normal, Foely c/d/i with very light pink urine  Lab Results:   Basename 07/10/12 0454 07/09/12 0449  WBC 8.6 11.8*  HGB 8.4* 9.3*  HCT 24.2* 27.0*  PLT 271 345   BMET  Basename 07/10/12 0454 07/09/12 0449  NA 128* 127*  K 4.6 4.8  CL 96 94*  CO2 22 23  GLUCOSE 97 122*  BUN 29* 31*  CREATININE 2.48* 2.94*  CALCIUM 8.4 9.0   PT/INR No results found for this basename: LABPROT:2,INR:2 in the last 72 hours ABG No results found for this basename: PHART:2,PCO2:2,PO2:2,HCO3:2 in the last 72 hours  Studies/Results: No results found.  Anti-infectives: Anti-infectives     Start     Dose/Rate Route Frequency Ordered Stop   07/08/12 0000   ciprofloxacin (CIPRO) IVPB 400 mg        400 mg 200 mL/hr over 60 Minutes Intravenous Every 12 hours 07/07/12 1404     07/07/12 1149   ciprofloxacin (CIPRO) IVPB 400 mg        400 mg 200 mL/hr over 60 Minutes Intravenous 60 min pre-op 07/07/12 1149 07/07/12 1230   06/30/12 1400   cefTRIAXone (ROCEPHIN) 1 g in  dextrose 5 % 50 mL IVPB  Status:  Discontinued        1 g 100 mL/hr over 30 Minutes Intravenous Every 24 hours 06/30/12 1345 07/01/12 0951   06/29/12 2000   ciprofloxacin (CIPRO) IVPB 400 mg  Status:  Discontinued        400 mg 200 mL/hr over 60 Minutes Intravenous Every 24 hours 06/29/12 1904 06/30/12 1344          Assessment/Plan: 1 - Metastatic Prostate Cancer - Now on androgen deprivation.   2 - Obstructive Uropathy - Maintain foley for now, will reconsider trial of void in few days.  3 - Will follow   LOS: 17 days    Elisia Stepp 07/10/2012

## 2012-07-10 NOTE — Progress Notes (Signed)
Patient resting.

## 2012-07-11 LAB — CBC
HCT: 23.5 % — ABNORMAL LOW (ref 39.0–52.0)
Hemoglobin: 8.1 g/dL — ABNORMAL LOW (ref 13.0–17.0)
WBC: 7.9 10*3/uL (ref 4.0–10.5)

## 2012-07-11 LAB — BASIC METABOLIC PANEL
BUN: 25 mg/dL — ABNORMAL HIGH (ref 6–23)
CO2: 23 mEq/L (ref 19–32)
Chloride: 98 mEq/L (ref 96–112)
Glucose, Bld: 98 mg/dL (ref 70–99)
Potassium: 4.5 mEq/L (ref 3.5–5.1)

## 2012-07-11 NOTE — Progress Notes (Signed)
Patient ID: Manuel Lucas, male   DOB: 1956/06/13, 56 y.o.   MRN: [id number]  [facility] PROGRESS NOTE  Manuel Lucas [id number]:[id number] DOB: January 19, 1956 DOA: 06/23/2012 PCP: Sheila Oats, MD  Brief narrative:  Pt is 56 yo male with metastatic prostate cancer who was admitted 07/24 with acute urinary retention secondary to obstructive uropathy in the setting of prostate cancer and was also found to be in acute renal failure with Cr > 5 as a result. He is now s/p TURP done by Dr. Isabel Caprice (07/07/2012).   Principal Problem:  *Prostate carcinoma  - metastatic and now status post TURP done 08/07 by Dr. Isabel Caprice  - will continue to monitor and follow up on recommendations  - Foley placed back as pt unable to void  - trial of void will be attempted in few days   Active Problems:  Acute renal failure  - creatinine trending down  - will continue gently hydration  - Foley in Place with no plan of removal for next few days  - BMP in AM   Anemia  - Hg slightly down from yesterday  - will obtain CBC in AM and will reassess if transfusion needed   Elevated blood pressure  - BP remains stable and within target range  - continue to monitor vitals per floor protocol   Obstructive uropathy  - secondary to prostate carcinoma  - status post TURP  - Foley in place   Leukocytosis  - WBC WNL today  - will obtain CBC in AM   Consultants:  Renal, Dr. Eliott Nine - will need follow up appointment in 2 weeks after the discharge from [facility] after TURP  Urology, Dr. Isabel Caprice  Psychiatry, Dr. Ferol Luz   Procedures and Radiological studies:  Renal ultrasound on 06/24/2012: Mild/moderate bilateral hydronephrosis. The urinary bladder is distended and there are bilateral ureteral jets. Findings raise concern for urinary bladder retention and/or bladder outlet obstruction.  Bone scan on 06/27/2012 : Negative bone scan for osseus metastatic disease  CT pelvis without contrast on 06/28/2012: Profound  lymphadenopathy in the pelvis, most notably in the external iliac chains bilaterally, 4.4 X7 0.3 cm on the right in 5.4 X4 0.3 cm on the left. Lymph nodes exert mass effect upon the adjacent vascular structures particularly on the right.  Renal ultrasound repeated on 07/01/2012: Unchanged mild to moderate bilateral symmetric hydronephrosis.  CT-guided biopsy of the iliac lymph node on 07/02/2012  Left Iliac Lymph Node Biopsy result:  By immunohistochemistry, the malignant cells are positive for cytokeratin AE1/AE3 and focally positive for PSA. They are negative for PSAP, CD45, Melan-A, and S100. Given the clinical scenario, the PSA positive staining is consistent with metastatic prostatic carcinoma.   Antibiotics:  Ciprofloxacin 07/07/2012 -->  Code Status: Full  Family Communication: Pt at bedside  Disposition Plan: SNF when medically stable   HPI/Subjective: No events overnight.   Objective: Filed Vitals:   07/10/12 1347 07/10/12 2105 07/11/12 0653 07/11/12 1131  BP: 129/78 115/72 105/68 106/69  Pulse: 87 98 82   Temp: 98.3 F (36.8 C) 98.9 F (37.2 C) 98.4 F (36.9 C)   TempSrc: Oral Oral Oral   Resp: 22 20 20    Height:      Weight:   74.6 kg (164 lb 7.4 oz)   SpO2: 97% 96% 94%     Intake/Output Summary (Last 24 hours) at 07/11/12 1315 Last data filed at 07/11/12 9562  Gross per 24 hour  Intake   1400 ml  Output  3550 ml  Net  -2150 ml    Exam:   General:  Pt is alert, follows commands appropriately, not in acute distress, frail and chronically ill appearing  Cardiovascular: Regular rate and rhythm, S1/S2, no murmurs, no rubs, no gallops  Respiratory: Clear to auscultation bilaterally, no wheezing, no crackles, no rhonchi  Abdomen: Soft, non tender, non distended, bowel sounds present, no guarding  Extremities: No edema, pulses DP and PT palpable bilaterally  Neuro: Grossly nonfocal  Data Reviewed: Basic Metabolic Panel:  Lab 07/11/12 1610 07/10/12 0454  07/09/12 0449 07/08/12 0520 07/06/12 0555 07/05/12 0525  NA 130* 128* 127* 130* 134* --  K 4.5 4.6 4.8 4.8 5.0 --  CL 98 96 94* 97 100 --  CO2 23 22 23 25 25  --  GLUCOSE 98 97 122* 111* 105* --  BUN 25* 29* 31* 23 26* --  CREATININE 2.30* 2.48* 2.94* 2.11* 2.27* --  CALCIUM 8.3* 8.4 9.0 8.8 8.9 --  MG -- -- -- -- -- --  PHOS -- -- -- 3.9 3.3 3.2   Liver Function Tests:  Lab 07/08/12 0520 07/06/12 0555 07/05/12 0525  AST -- -- --  ALT -- -- --  ALKPHOS -- -- --  BILITOT -- -- --  PROT -- -- --  ALBUMIN 2.5* 2.6* 2.5*   No results found for this basename: LIPASE:5,AMYLASE:5 in the last 168 hours No results found for this basename: AMMONIA:5 in the last 168 hours CBC:  Lab 07/11/12 0520 07/10/12 0454 07/09/12 0449 07/08/12 0520 07/07/12 1454 07/06/12 0555  WBC 7.9 8.6 11.8* 8.1 -- 8.7  NEUTROABS -- -- -- -- -- --  HGB 8.1* 8.4* 9.3* 9.0* 8.7* --  HCT 23.5* 24.2* 27.0* 26.4* 25.3* --  MCV 80.5 79.6 80.6 80.5 -- 81.1  PLT 267 271 345 304 -- 308   Cardiac Enzymes: No results found for this basename: CKTOTAL:5,CKMB:5,CKMBINDEX:5,TROPONINI:5 in the last 168 hours BNP: No components found with this basename: POCBNP:5 CBG: No results found for this basename: GLUCAP:5 in the last 168 hours  Recent Results (from the past 240 hour(s))  SURGICAL PCR SCREEN     Status: Normal   Collection Time   07/07/12 10:51 AM      Component Value Range Status Comment   MRSA, PCR NEGATIVE  NEGATIVE Final    Staphylococcus aureus NEGATIVE  NEGATIVE Final      Scheduled Meds:   . amLODipine  10 mg Oral Daily  . calcitRIOL  0.25 mcg Oral QODAY  . ciprofloxacin  400 mg Intravenous Q12H  . darbepoetin (ARANESP) injection - NON-DIALYSIS  60 mcg Subcutaneous Q Wed-1800  . docusate sodium  100 mg Oral Daily  . lidocaine   Urethral Once  . pantoprazole  40 mg Oral BID AC  . sodium chloride  3 mL Intravenous Q12H   Continuous Infusions:   . sodium chloride 50 mL/hr at 07/07/12 0045  . dextrose  5 % and 0.45% NaCl 50 mL/hr at 07/10/12 0705  . lactated ringers    . lactated ringers       Debbora Presto, MD  [facility] Pager [phone]  If 7PM-7AM, please contact night-coverage [email or web address] Password [email or web address] 07/11/2012, 1:15 PM   LOS: 18 days

## 2012-07-12 LAB — CBC
HCT: 23.9 % — ABNORMAL LOW (ref 39.0–52.0)
Hemoglobin: 8.1 g/dL — ABNORMAL LOW (ref 13.0–17.0)
MCH: 27.6 pg (ref 26.0–34.0)
MCHC: 33.9 g/dL (ref 30.0–36.0)

## 2012-07-12 LAB — BASIC METABOLIC PANEL
BUN: 23 mg/dL (ref 6–23)
GFR calc non Af Amer: 29 mL/min — ABNORMAL LOW (ref >90)
Glucose, Bld: 105 mg/dL — ABNORMAL HIGH (ref 70–99)
Potassium: 4.5 mEq/L (ref 3.5–5.1)

## 2012-07-12 LAB — GLUCOSE, CAPILLARY: Glucose-Capillary: 144 mg/dL — ABNORMAL HIGH (ref 70–99)

## 2012-07-12 MED ORDER — BISACODYL 10 MG RE SUPP: 10.0000 mg | Freq: Two times a day (BID) | RECTAL | Status: DC

## 2012-07-12 MED ORDER — DOCUSATE SODIUM 100 MG PO CAPS
100.0000 mg | ORAL_CAPSULE | Freq: Two times a day (BID) | ORAL | Status: DC
  Filled 2012-07-12 (×5): qty 1

## 2012-07-12 MED ORDER — LIDOCAINE 5 % EX PTCH
1.0000 | MEDICATED_PATCH | Freq: Two times a day (BID) | CUTANEOUS | Status: DC
  Administered 2012-07-12 – 2012-07-14 (×5): 1 via TRANSDERMAL
  Filled 2012-07-12 (×6): qty 1

## 2012-07-12 NOTE — Progress Notes (Signed)
Urology Progress Note  5 Days Post-Op  1 - Metastatic Prostate Cancer - new diagnosis, biopsy-proven from pelvic lymph node. Initial PSA >100. Now s/p [employer] injection for androgen deprivation.  2 - Obstructive Uropathy - s/p channel TURP 8/7. Failed initial trial of void, now with foley replaced w/o problems. Initial Cr high 5's, now stable in mid 2's.  Urology re-consulted prior to d/c for catheter disposition.     Subjective:     No acute urologic events overnight. Ambulation:   negative Flatus:    positive Bowel movement  positive  Pain: some relief  Objective:  Blood pressure 113/74, pulse 90, temperature 98.9 F (37.2 C), temperature source Oral, resp. rate 19, height 5\' 9"  (1.753 m), weight 75.1 kg (165 lb 9.1 oz), SpO2 96.00%.  Physical Exam:  General:  No acute distress, awake Extremities: extremities normal, atraumatic, no cyanosis or edema Genitourinary:  No pain Foley:urine clear    I/O last 3 completed shifts: In: 4240 [P.O.:1880; I.V.:1560; IV Piggyback:800] Out: 4700 [Urine:4700]  Recent Labs  [facility] 07/12/12 0430 07/11/12 0520   HGB 8.1* 8.1*   WBC 7.5 7.9   PLT 264 267    Recent Labs  Basename 07/12/12 0430 07/11/12 0520   NA 134* 130*   K 4.5 4.5   CL 101 98   CO2 25 23   BUN 23 25*   CREATININE 2.35* 2.30*   CALCIUM 8.6 8.3*   GFRNONAA 29* 30*   GFRAA 33* 34*     No results found for this basename: PT:2,INR:2,APTT:2 in the last 72 hours   No components found with this basename: ABG:2  Assessment/Plan:  Catheter not removed. Continue any current medications. Follow up in 1 week with Dr. Isabel Caprice, [phone].

## 2012-07-12 NOTE — Progress Notes (Signed)
Patient ID: Manuel Lucas, male   DOB: [birth year], 56 y.o.   MRN: [id number]  [facility] PROGRESS NOTE  Manuel Lucas [id number] DOB: [birth year] DOA: 06/23/2012 PCP: Sheila Oats, MD  Brief narrative:  Pt is 56 yo male with metastatic prostate cancer who was admitted 07/24 with acute urinary retention secondary to obstructive uropathy in the setting of prostate cancer and was also found to be in acute renal failure with Cr > 5 as a result. He is now s/p TURP done by Dr. Isabel Caprice (07/07/2012). Foley was removed one day post TURP and pt was unable to void. Foley placed back in 08/10 and currently still in place with plan of voiding trial in 1-2 days.   Principal Problem:  *Prostate carcinoma  - metastatic and now status post TURP done 08/07 by Dr. Isabel Caprice  - will continue to monitor and follow up on recommendations  - Foley placed back as pt unable to void  - trial of void will be attempted in 1 - 2 days   Active Problems:  Acute renal failure  - creatinine unchanged since yesterday  - will continue gentle hydration  - Foley in Place with no plan of removal for next few days  - BMP in AM   Anemia  - Hg stable since yesterday - non need for transfusion at this point - will obtain CBC in AM and will reassess if transfusion needed   Elevated blood pressure  - BP remains stable and within target range  - continue to monitor vitals per floor protocol   Obstructive uropathy  - secondary to prostate carcinoma  - status post TURP  - Foley in place   Leukocytosis  - WBC WNL today  - will obtain CBC in AM   Consultants:  Renal, Dr. Eliott Nine - will need follow up appointment in 2 weeks after the discharge from [facility] after TURP  Urology, Dr. Isabel Caprice  Psychiatry, Dr. Ferol Luz   Procedures and Radiological studies:  Renal ultrasound on 06/24/2012: Mild/moderate bilateral hydronephrosis. The urinary bladder is distended and there are bilateral ureteral jets. Findings  raise concern for urinary bladder retention and/or bladder outlet obstruction.  Bone scan on 06/27/2012 : Negative bone scan for osseus metastatic disease  CT pelvis without contrast on 06/28/2012: Profound lymphadenopathy in the pelvis, most notably in the external iliac chains bilaterally, 4.4 X7 0.3 cm on the right in 5.4 X4 0.3 cm on the left. Lymph nodes exert mass effect upon the adjacent vascular structures particularly on the right.  Renal ultrasound repeated on 07/01/2012: Unchanged mild to moderate bilateral symmetric hydronephrosis.  CT-guided biopsy of the iliac lymph node on 07/02/2012  Left Iliac Lymph Node Biopsy result:  By immunohistochemistry, the malignant cells are positive for cytokeratin AE1/AE3 and focally positive for PSA. They are negative for PSAP, CD45, Melan-A, and S100. Given the clinical scenario, the PSA positive staining is consistent with metastatic prostatic carcinoma.   Antibiotics:  Ciprofloxacin 07/07/2012 -->  Code Status: Full  Family Communication: Pt at bedside  Disposition Plan: SNF when medically stable    HPI/Subjective: No events overnight.   Objective: Filed Vitals:   07/11/12 1131 07/11/12 1526 07/11/12 2217 07/12/12 0620  BP: 106/69 109/71 101/66 108/69  Pulse:  90 90 80  Temp:  100.7 F (38.2 C) 98.9 F (37.2 C) 98.6 F (37 C)  TempSrc:  Oral Oral Oral  Resp:  22 20 20   Height:      Weight:    75.1 kg (165  lb 9.1 oz)  SpO2:  97% 97% 96%    Intake/Output Summary (Last 24 hours) at 07/12/12 0915 Last data filed at 07/12/12 0654  Gross per 24 hour  Intake   2520 ml  Output   3200 ml  Net   -680 ml    Exam:   General:  Pt is alert, follows commands appropriately, not in acute distress  Cardiovascular: Regular rate and rhythm, S1/S2, no murmurs, no rubs, no gallops  Respiratory: Clear to auscultation bilaterally, no wheezing, no crackles, no rhonchi  Abdomen: Soft, non tender, non distended, bowel sounds present, no  guarding  Extremities: No edema, pulses DP and PT palpable bilaterally  Neuro: Grossly nonfocal  Data Reviewed: Basic Metabolic Panel:  Lab 07/12/12 4782 07/11/12 0520 07/10/12 0454 07/09/12 0449 07/08/12 0520 07/06/12 0555  NA 134* 130* 128* 127* 130* --  K 4.5 4.5 4.6 4.8 4.8 --  CL 101 98 96 94* 97 --  CO2 25 23 22 23 25  --  GLUCOSE 105* 98 97 122* 111* --  BUN 23 25* 29* 31* 23 --  CREATININE 2.35* 2.30* 2.48* 2.94* 2.11* --  CALCIUM 8.6 8.3* 8.4 9.0 8.8 --  MG -- -- -- -- -- --  PHOS -- -- -- -- 3.9 3.3   Liver Function Tests:  Lab 07/08/12 0520 07/06/12 0555  AST -- --  ALT -- --  ALKPHOS -- --  BILITOT -- --  PROT -- --  ALBUMIN 2.5* 2.6*   CBC:  Lab 07/12/12 0430 07/11/12 0520 07/10/12 0454 07/09/12 0449 07/08/12 0520  WBC 7.5 7.9 8.6 11.8* 8.1  NEUTROABS -- -- -- -- --  HGB 8.1* 8.1* 8.4* 9.3* 9.0*  HCT 23.9* 23.5* 24.2* 27.0* 26.4*  MCV 81.6 80.5 79.6 80.6 80.5  PLT 264 267 271 345 304   CBG:  Lab 07/11/12 2215  GLUCAP 113*    Recent Results (from the past 240 hour(s))  SURGICAL PCR SCREEN     Status: Normal   Collection Time   07/07/12 10:51 AM      Component Value Range Status Comment   MRSA, PCR NEGATIVE  NEGATIVE Final    Staphylococcus aureus NEGATIVE  NEGATIVE Final      Scheduled Meds:   . amLODipine  10 mg Oral Daily  . calcitRIOL  0.25 mcg Oral QODAY  . ciprofloxacin  400 mg Intravenous Q12H  . darbepoetin injection - NON-HD  60 mcg Subcutaneous Q Wed-1800  . docusate sodium  100 mg Oral Daily  . pantoprazole  40 mg Oral BID AC   Continuous Infusions:   . sodium chloride 50 mL/hr at 07/07/12 0045  . dextrose 5 % and 0.45% NaCl 50 mL/hr at 07/10/12 0705  . lactated ringers    . lactated ringers       Debbora Presto, MD  [facility] Pager [phone]  If 7PM-7AM, please contact night-coverage [email or web address] Password [email or web address] 07/12/2012, 9:15 AM   LOS: 19 days

## 2012-07-12 NOTE — Progress Notes (Signed)
PT Cancellation Note  Treatment cancelled today due to pt reports "I can't do anything today.  I need to let it pass (holding stomach)."  Pt would not explain any further. Pt denied need for pain meds and answered no when asked if therapist could get him anything or need to tell his RN anything.  Benaiah Behan,KATHrine E 07/12/2012, 11:23 AM Pager: [phone]

## 2012-07-12 NOTE — Progress Notes (Signed)
CSW continues to follow for discharge planning. Patient to go to [facility] SNF with Letter of Guarantee from the hosptial at discharge.   Unice Bailey, LCSW [facility] Clinical Social Worker cell #: [phone]

## 2012-07-13 LAB — BASIC METABOLIC PANEL
CO2: 24 mEq/L (ref 19–32)
Calcium: 8.3 mg/dL — ABNORMAL LOW (ref 8.4–10.5)
Creatinine, Ser: 2.17 mg/dL — ABNORMAL HIGH (ref 0.50–1.35)
Glucose, Bld: 102 mg/dL — ABNORMAL HIGH (ref 70–99)

## 2012-07-13 LAB — CBC
MCH: 27.6 pg (ref 26.0–34.0)
MCV: 80.5 fL (ref 78.0–100.0)
Platelets: 258 10*3/uL (ref 150–400)
RBC: 2.97 MIL/uL — ABNORMAL LOW (ref 4.22–5.81)

## 2012-07-13 MED ORDER — LIDOCAINE 5 % EX PTCH: 1.0000 | MEDICATED_PATCH | Freq: Two times a day (BID) | CUTANEOUS | Status: DC

## 2012-07-13 MED ORDER — CALCITRIOL 0.25 MCG PO CAPS: 0.2500 ug | ORAL_CAPSULE | ORAL | Status: AC

## 2012-07-13 MED ORDER — PANTOPRAZOLE SODIUM 40 MG PO TBEC: 40.0000 mg | DELAYED_RELEASE_TABLET | Freq: Two times a day (BID) | ORAL | Status: AC

## 2012-07-13 MED ORDER — HYDROCODONE-ACETAMINOPHEN 5-325 MG PO TABS: 1.0000 | ORAL_TABLET | ORAL | Status: DC | PRN

## 2012-07-13 MED ORDER — CIPROFLOXACIN HCL 500 MG PO TABS: 500.0000 mg | ORAL_TABLET | Freq: Two times a day (BID) | ORAL | Status: AC

## 2012-07-13 MED ORDER — SIMETHICONE 80 MG PO CHEW: 80.0000 mg | CHEWABLE_TABLET | Freq: Four times a day (QID) | ORAL | Status: DC | PRN

## 2012-07-13 MED ORDER — ONDANSETRON HCL 4 MG PO TABS: 4.0000 mg | ORAL_TABLET | Freq: Four times a day (QID) | ORAL | Status: AC | PRN

## 2012-07-13 MED ORDER — AMLODIPINE BESYLATE 10 MG PO TABS: 10.0000 mg | ORAL_TABLET | Freq: Every day | ORAL | Status: AC

## 2012-07-13 MED ORDER — CIPROFLOXACIN HCL 500 MG PO TABS
500.0000 mg | ORAL_TABLET | Freq: Two times a day (BID) | ORAL | Status: DC
  Administered 2012-07-13 – 2012-07-14 (×3): 500 mg via ORAL
  Filled 2012-07-13 (×5): qty 1

## 2012-07-13 NOTE — Discharge Summary (Signed)
Physician Discharge Summary  Manuel Lucas ZOX:[id number] DOB: 1956-01-28 DOA: 06/23/2012  PCP: Sheila Oats, MD  Admit date: 06/23/2012 Discharge date: 07/13/2012  Recommendations for Outpatient Follow-up:  1. Pt will need to follow up with Dr. Isabel Caprice in 1 week 2. Please note that pt will leave the hospital with foley in place and when he sees Dr. Isabel Caprice in the office they will do trial of void in an outpatient setting 3. Please obtain BMP to evaluate electrolytes and kidney function, creatinine on discharge 2.13 and at pt's baseline 4. Please also check CBC to evaluate Hg and Hct levels 5. Pt has bed available at [facility] 6. Pt discharged on Ciprofloxacin 500 mg tablet to take BID for 5 additional days, stop date 07/19/2012  Discharge Diagnoses: Acute urinary obstruction and renal failure secondary to prostate carcinoma Principal Problem:  *Prostate carcinoma Active Problems:  Acute renal failure  Anemia  Elevated blood pressure  Obstructive uropathy  Localized enlarged lymph nodes  Leukocytosis  Discharge Condition: Stable  Diet recommendation: Heart healthy diet discussed in details   Brief narrative:  Pt is 56 yo male with metastatic prostate cancer who was admitted 07/24 with acute urinary retention secondary to obstructive uropathy in the setting of prostate cancer and was also found to be in acute renal failure with Cr > 5 as a result. He is now s/p TURP done by Dr. Isabel Caprice (07/07/2012). Foley was removed one day post TURP and pt was unable to void. Foley placed back in 08/10 and currently still in place with plan of voiding trial in an outpatient setting, therefore please note that pt will leave the hospital with Foley in Place.   Principal Problem:  *Prostate carcinoma  - metastatic and now status post TURP done 08/07 by Dr. Isabel Caprice  - pt will need to see Dr. Isabel Caprice in an outpatient setting in 1 week to reassess and attempt trial of voiding - Foley was initially  removed one day post TURP but pt unable to void and Foley was placed back in  Active Problems:  Acute renal failure  - creatinine slightly trending down and now at pt's baseline - this was determined to be secondary to acute urinary obstruction and Cr on admission was ~5 - Foley in place and with IVF, urine looks much more clear and less bloody - pt tolerating Foley Well  Anemia  - Hg stable since yesterday  - no need for transfusion at this point  - will obtain CBC prior to discharge and will also need follow up CBC in an outpatient setting  Elevated blood pressure  - BP remains stable and within target range  - continued to monitor vitals per floor protocol   Obstructive uropathy  - secondary to prostate carcinoma  - status post TURP  - Foley in place and will be discharged with Foley until seen by D.r Grapey in the office  Leukocytosis  - WBC WNL today  - will complete Ciprofloxacin for 5 additional days post discharge and stop date is 07/19/2012 - scripts placed in chart, will need to be signed in AM prior to discharge  Consultants:  Renal, Dr. Eliott Nine - will need follow up appointment in 2 weeks after the discharge from [facility] after TURP  Urology, Dr. Isabel Caprice  Psychiatry, Dr. Ferol Luz   Procedures and Radiological studies:  Renal ultrasound on 06/24/2012: Mild/moderate bilateral hydronephrosis. The urinary bladder is distended and there are bilateral ureteral jets. Findings raise concern for urinary bladder retention and/or bladder  outlet obstruction.  Bone scan on 06/27/2012 : Negative bone scan for osseus metastatic disease  CT pelvis without contrast on 06/28/2012: Profound lymphadenopathy in the pelvis, most notably in the external iliac chains bilaterally, 4.4 X7 0.3 cm on the right in 5.4 X4 0.3 cm on the left. Lymph nodes exert mass effect upon the adjacent vascular structures particularly on the right.  Renal ultrasound repeated on 07/01/2012: Unchanged  mild to moderate bilateral symmetric hydronephrosis.  CT-guided biopsy of the iliac lymph node on 07/02/2012  Left Iliac Lymph Node Biopsy result: By immunohistochemistry, the malignant cells are positive for cytokeratin AE1/AE3 and focally positive for PSA. They are negative for PSAP, CD45, Melan-A, and S100. Given the clinical scenario, the PSA positive staining is consistent with metastatic prostatic carcinoma.   Antibiotics:  Ciprofloxacin 07/07/2012 --> STP DATE: 07/19/2012  Code Status: Full  Family Communication: Pt at bedside  Disposition Plan: SNF in AM, [facility]  Discharge Exam: Filed Vitals:   07/13/12 1410  BP: 108/62  Pulse: 78  Temp: 98.8 F (37.1 C)  Resp: 18   Filed Vitals:   07/12/12 1457 07/12/12 2110 07/13/12 0541 07/13/12 1410  BP: 113/74 124/74 109/69 108/62  Pulse: 90 96 79 78  Temp: 98.9 F (37.2 C) 98.4 F (36.9 C) 98.5 F (36.9 C) 98.8 F (37.1 C)  TempSrc: Oral Oral Oral Oral  Resp: 19 19 18 18   Height:      Weight:      SpO2: 96% 96% 100% 99%    General: Pt is alert, follows commands appropriately, not in acute distress Cardiovascular: Regular rate and rhythm, S1/S2 +, no murmurs, no rubs, no gallops Respiratory: Clear to auscultation bilaterally, no wheezing, no crackles, no rhonchi Abdominal: Soft, tender in epigastric area, non distended, bowel sounds +, no guarding, foley in place Extremities: no edema, no cyanosis, pulses palpable bilaterally DP and PT Neuro: Grossly nonfocal  Discharge Instructions   Medication List  As of 07/13/2012  6:18 PM   TAKE these medications         amLODipine 10 MG tablet   Commonly known as: NORVASC   Take 1 tablet (10 mg total) by mouth daily.      calcitRIOL 0.25 MCG capsule   Commonly known as: ROCALTROL   Take 1 capsule (0.25 mcg total) by mouth every other day.      ciprofloxacin 500 MG tablet   Commonly known as: CIPRO   Take 1 tablet (500 mg total) by mouth 2 (two) times daily.       HYDROcodone-acetaminophen 5-325 MG per tablet   Commonly known as: NORCO/VICODIN   Take 1-2 tablets by mouth every 4 (four) hours as needed.      lidocaine 5 %   Commonly known as: LIDODERM   Place 1 patch onto the skin 2 (two) times daily. Remove & Discard patch within 12 hours or as directed by MD      ondansetron 4 MG tablet   Commonly known as: ZOFRAN   Take 1 tablet (4 mg total) by mouth every 6 (six) hours as needed for nausea.      pantoprazole 40 MG tablet   Commonly known as: PROTONIX   Take 1 tablet (40 mg total) by mouth 2 (two) times daily before a meal.      simethicone 80 MG chewable tablet   Commonly known as: MYLICON   Chew 1 tablet (80 mg total) by mouth every 6 (six) hours as needed for flatulence.  Follow-up Information    Follow up with [facility], MD in 1 week.   Contact information:   [address] [facility] [place] [address] [phone]           The results of significant diagnostics from this hospitalization (including imaging, microbiology, ancillary and laboratory) are listed below for reference.     Microbiology: Recent Results (from the past 240 hour(s))  SURGICAL PCR SCREEN     Status: Normal   Collection Time   07/07/12 10:51 AM      Component Value Range Status Comment   MRSA, PCR NEGATIVE  NEGATIVE Final    Staphylococcus aureus NEGATIVE  NEGATIVE Final      Labs: Basic Metabolic Panel:  Lab 07/13/12 9528 07/12/12 0430 07/11/12 0520 07/10/12 0454 07/09/12 0449 07/08/12 0520  NA 133* 134* 130* 128* 127* --  K 4.2 4.5 4.5 4.6 4.8 --  CL 100 101 98 96 94* --  CO2 24 25 23 22 23  --  GLUCOSE 102* 105* 98 97 122* --  BUN 21 23 25* 29* 31* --  CREATININE 2.17* 2.35* 2.30* 2.48* 2.94* --  CALCIUM 8.3* 8.6 8.3* 8.4 9.0 --  MG -- -- -- -- -- --  PHOS -- -- -- -- -- 3.9   Liver Function Tests:  Lab 07/08/12 0520  AST --  ALT --  ALKPHOS --  BILITOT --    PROT --  ALBUMIN 2.5*   CBC:  Lab 07/13/12 0453 07/12/12 0430 07/11/12 0520 07/10/12 0454 07/09/12 0449  WBC 6.5 7.5 7.9 8.6 11.8*  NEUTROABS -- -- -- -- --  HGB 8.2* 8.1* 8.1* 8.4* 9.3*  HCT 23.9* 23.9* 23.5* 24.2* 27.0*  MCV 80.5 81.6 80.5 79.6 80.6  PLT 258 264 267 271 345   CBG:  Lab 07/13/12 0751 07/12/12 2108 07/11/12 2215  GLUCAP 107* 144* 113*    SIGNED: Time coordinating discharge: Over 30 minutes  Debbora Presto, MD  [facility] 07/13/2012, 6:18 PM Pager [phone]  If 7PM-7AM, please contact night-coverage [email or web address] Password [email or web address]

## 2012-07-13 NOTE — Progress Notes (Addendum)
Brief Nutrition Note  Reason: Length of Stay  Patient reported his appetite and intake have been well. Per RN patient is eating well. PO intake documented 100 at meals on regular diet. Patient was without any nutrition related questions or concerns.   RD to monitor for nutrition needs.   Manuel Lucas [facility] [phone]

## 2012-07-13 NOTE — Progress Notes (Signed)
Physical Therapy Treatment Patient Details Name: Quintavious Rinck MRN: [id number] DOB: 1956/11/18 Today's Date: 07/13/2012 Time: 1400-1420 PT Time Calculation (min): 20 min  PT Assessment / Plan / Recommendation Comments on Treatment Session  Pt progressing slowly and would benefit from ST SNF Rehab prior to D/C to home.    Follow Up Recommendations  Skilled nursing facility    Barriers to Discharge        Equipment Recommendations  Defer to next venue    Recommendations for Other Services    Frequency     Plan Discharge plan remains appropriate    Precautions / Restrictions Precautions Precautions: Fall Restrictions Weight Bearing Restrictions: No    Pertinent Vitals/Pain C/o mod ABD distention/discomfort    Mobility  Bed Mobility Bed Mobility: Supine to Sit Supine to Sit: 5: Supervision Details for Bed Mobility Assistance: increased time 2nd c/o distended ABD  Transfers Transfers: Sit to Stand;Stand to Sit Sit to Stand: 4: Min guard;From bed Stand to Sit: 4: Min guard;To bed Details for Transfer Assistance: 25% VC's on hand placement and increased time  Ambulation/Gait Ambulation/Gait Assistance: 4: Min guard Ambulation Distance (Feet): 250 Feet Assistive device: Rolling walker Ambulation/Gait Assistance Details: able to amb increased distance with mod c/o distended ABD.  Pt also demonstrates limited activity tolerance and requires much resr recovery before next activity. Gait Pattern: Step-through pattern;Decreased stride length;Trunk flexed     PT Goals          progressing    Visit Information  Last PT Received On: 07/13/12 Assistance Needed: +1    Subjective Data  Subjective: all they do is give me pills and more pills Patient Stated Goal: home         Balance   fair  End of Session PT - End of Session Equipment Utilized During Treatment: Gait belt Activity Tolerance: Patient limited by fatigue;Patient limited by pain Patient left: in bed;with  call bell/phone within reach   Felecia Shelling  PTA [facility]  Acute  Rehab Pager     [phone]

## 2012-07-13 NOTE — Progress Notes (Signed)
PHARMACIST - PHYSICIAN COMMUNICATION DR:  Izola Price CONCERNING: Antibiotic IV to Oral Route Change Policy  RECOMMENDATION: This patient is receiving Cipro by the intravenous route.  Based on criteria approved by the Pharmacy and Therapeutics Committee, the antibiotic(s) is/are being converted to the equivalent oral dose form(s).   DESCRIPTION: These criteria include:  Patient being treated for a respiratory tract infection, urinary tract infection, or cellulitis  The patient is not neutropenic and does not exhibit a GI malabsorption state  The patient is eating (either orally or via tube) and/or has been taking other orally administered medications for a least 24 hours  The patient is improving clinically and has a Tmax < 100.5  If you have questions about this conversion, please contact the Pharmacy Department  []   [phone] )  [facility] []   [phone] )  [facility]  []   [phone] )  [facility] [x]   [phone] )  [facility]   Thank you, Loralee Pacas, PharmD, BCPS 07/13/2012 10:38 AM

## 2012-07-14 DIAGNOSIS — R339 Retention of urine, unspecified: Secondary | ICD-10-CM | POA: Diagnosis present

## 2012-07-14 LAB — CBC
Platelets: 269 10*3/uL (ref 150–400)
RBC: 2.94 MIL/uL — ABNORMAL LOW (ref 4.22–5.81)
RDW: 14.4 % (ref 11.5–15.5)
WBC: 6.3 10*3/uL (ref 4.0–10.5)

## 2012-07-14 LAB — BASIC METABOLIC PANEL
CO2: 23 mEq/L (ref 19–32)
Chloride: 101 mEq/L (ref 96–112)
GFR calc Af Amer: 36 mL/min — ABNORMAL LOW (ref >90)
Potassium: 4.5 mEq/L (ref 3.5–5.1)
Sodium: 134 mEq/L — ABNORMAL LOW (ref 135–145)

## 2012-07-14 MED ORDER — ACETAMINOPHEN 325 MG PO TABS: 650.0000 mg | ORAL_TABLET | Freq: Four times a day (QID) | ORAL | Status: AC | PRN

## 2012-07-14 MED ORDER — HYDROCODONE-ACETAMINOPHEN 5-325 MG PO TABS: 1.0000 | ORAL_TABLET | ORAL | Status: DC | PRN

## 2012-07-14 NOTE — Progress Notes (Signed)
Report called to [place] at [phone].  Let them know his code status, weight, foley, low sodium heart healthy diet, and fall risk.  Manuel Lucas

## 2012-07-14 NOTE — Progress Notes (Signed)
PCP: Does not have one  Subjective: Patient feels well. Had questions regarding the enlarged lymph nodes which were answered. No other complaints.  Objective: Vital signs in last 24 hours: Temp:  [98.2 F (36.8 C)-98.8 F (37.1 C)] 98.2 F (36.8 C) (08/14 0543) Pulse Rate:  [67-78] 67  (08/14 0543) Resp:  [18] 18  (08/14 0543) BP: (105-117)/(62-73) 105/69 mmHg (08/14 0543) SpO2:  [98 %-99 %] 98 % (08/14 0543) Weight:  [75.2 kg (165 lb 12.6 oz)] 75.2 kg (165 lb 12.6 oz) (08/14 0543) Weight change:  Last BM Date: 07/13/12  Intake/Output from previous day: 08/13 0701 - 08/14 0700 In: 1100 [P.O.:1100] Out: 2975 [Urine:2975] Intake/Output this shift: Total I/O In: 320 [P.O.:320] Out: -   General appearance: alert, cooperative, appears stated age and no distress Resp: clear to auscultation bilaterally Cardio: regular rate and rhythm, S1, S2 normal, no murmur, click, rub or gallop GI: soft, bowel sounds normal; no masses,  no organomegaly.  Neurologic: Grossly normal  Lab Results:  Basename 07/14/12 0418 07/13/12 0453  WBC 6.3 6.5  HGB 8.0* 8.2*  HCT 24.1* 23.9*  PLT 269 258   BMET  Basename 07/14/12 0418 07/13/12 0453  NA 134* 133*  K 4.5 4.2  CL 101 100  CO2 23 24  GLUCOSE 97 102*  BUN 25* 21  CREATININE 2.23* 2.17*  CALCIUM 8.5 8.3*  ALT -- --    Studies/Results: No results found.  Medications:  Scheduled:   . amLODipine  10 mg Oral Daily  . calcitRIOL  0.25 mcg Oral QODAY  . ciprofloxacin  500 mg Oral BID  . darbepoetin (ARANESP) injection - NON-DIALYSIS  60 mcg Subcutaneous Q Wed-1800  . docusate sodium  100 mg Oral BID  . lidocaine  1 patch Transdermal BID  . lidocaine   Urethral Once  . pantoprazole  40 mg Oral BID AC  . sodium chloride  3 mL Intravenous Q12H  . DISCONTD: bisacodyl  10 mg Rectal BID   Continuous:   . DISCONTD: dextrose 5 % and 0.45% NaCl 50 mL/hr at 07/12/12 2248   WUJ:WJXBJYNWGNFAO, acetaminophen, hydrALAZINE,  HYDROcodone-acetaminophen, HYDROmorphone (DILAUDID) injection, lidocaine, ondansetron (ZOFRAN) IV, ondansetron, simethicone, DISCONTD: oxyCODONE  Assessment/Plan:  Principal Problem:  *Prostate carcinoma Active Problems:  Acute renal failure  Anemia  Elevated blood pressure  Obstructive uropathy  Localized enlarged lymph nodes  Leukocytosis  Urinary retention    Prostate carcinoma  - metastatic and now status post TURP done 08/07 by Dr. Isabel Caprice  - pt will need to see Dr. Isabel Caprice in an outpatient setting in 1 week to reassess and attempt trial of voiding and to discuss further treatment - Foley was initially removed one day post TURP but pt unable to void and Foley was placed back in   Acute renal failure  - creatinine trending down and now at pt's baseline  - this was determined to be secondary to acute urinary obstruction and Cr on admission was ~5  - Foley in place and with IVF, urine looks much more clear and less bloody  - pt tolerating Foley Well.  - Patient was seen bu Nephrology initially and he will need to see them in follow up.  Anemia  - Hg stable. He was on Aranesp in the hospital. Will need to follow up with nephrology. Patient had refused blood transfusions  Elevated blood pressure  - BP remains stable and within target range   Obstructive uropathy  - secondary to prostate carcinoma  - status  post TURP  - Foley in place and will be discharged with Foley until seen by D.r Grapey in the office   Leukocytosis  - WBC WNL today  - will complete Ciprofloxacin for 5 additional days post discharge and stop date is 07/19/2012   Code Status Full Code  Patient is stable for discharge to SNF.   LOS: 21 days   [facility]  [facility] Pager [phone] 07/14/2012, 11:51 AM

## 2012-07-14 NOTE — Progress Notes (Signed)
Assessment unchanged.  All questions pertaining to D/C we answered.  Pt was D/C'd via stretcher and accompanied by non-emergency ambulance personnel to [facility] [place].  Manuel Lucas

## 2012-07-14 NOTE — Progress Notes (Signed)
Pt asked for assistance with getting copies of his medical records. Procedure and cost explained for obtaining medical records and pt decided to wait and come back to get copies of his medical records. Pt given Medical Records Release Form and he was informed on how to fill it out. Also on the form pt was given the phone and fax number for Medical Records in case he needed to contact them. Pt also given price sheet for copies of medial records. Also pt asked for assistance in locating his car, as his ex-wife got a phone call that it was going to be towed. I was able to locate pt's care at [facility] and per Larita Fife with Security they had contacted pt's ex-wife's insurance, whose name was registered with the DMV tags, and they were going to contact the ex-wife. I called the ex-wife my self and gave her Lynn's name and number for contact and told her to call as soon as possible to clarify the car being towed. Pt's ex-wife agreed to call Larita Fife. Pt informed of information found out and he understood they process that Security had to take. He stated he would follow up with his ex-wife about his car. Pt seemed satisfied at this time with the information he had been given. Pt to be d/ced to [place] now.

## 2012-07-14 NOTE — Progress Notes (Signed)
Patient is set to discharge to [facility] today. Patient aware. [facility] called for transport.   Clinical Social Work Department CLINICAL SOCIAL WORK PLACEMENT NOTE 07/14/2012  Patient:  Manuel Lucas, Manuel Lucas  Account Number:  [id number] Admit date:  06/23/2012  Clinical Social Worker:  Genelle Bal, LCSW  Date/time:  06/30/2012 05:01 AM  Clinical Social Work is seeking post-discharge placement for this patient at the following level of care:   SKILLED NURSING   (*CSW will update this form in Epic as items are completed)   06/30/2012  Patient/family provided with [facility] Department of Clinical Social Work's list of facilities offering this level of care within the geographic area requested by the patient (or if unable, by the patient's family).  06/30/2012  Patient/family informed of their freedom to choose among providers that offer the needed level of care, that participate in Medicare, Medicaid or managed care program needed by the patient, have an available bed and are willing to accept the patient.    Patient/family informed of [facility]' ownership interest in [facility], as well as of the fact that they are under no obligation to receive care at this facility.  PASARR submitted to EDS on 06/30/2012 PASARR number received from EDS on 06/30/2012  FL2 transmitted to all facilities in geographic area requested by pt/family on  06/30/2012 FL2 transmitted to all facilities within larger geographic area on   Patient informed that his/her managed care company has contracts with or will negotiate with  certain facilities, including the following:     Patient/family informed of bed offers received:  06/30/2012 Patient chooses bed at [facility] Physician recommends and patient chooses bed at    Patient to be transferred to [facility] on  07/14/2012 Patient to be transferred to facility by [facility]  The following physician request were entered in  Epic:   Additional Comments: 07/01/12 Lacinda Axon extending bed offer with LOG per TC with admissions director.    Unice Bailey, LCSW [facility] Clinical Social Worker cell #: [phone]

## 2012-07-15 ENCOUNTER — Emergency Department (HOSPITAL_COMMUNITY): Payer: Self-pay

## 2012-07-15 ENCOUNTER — Other Ambulatory Visit: Payer: Self-pay

## 2012-07-15 ENCOUNTER — Encounter (HOSPITAL_COMMUNITY): Payer: Self-pay

## 2012-07-15 ENCOUNTER — Emergency Department (HOSPITAL_COMMUNITY)
Admission: EM | Admit: 2012-07-15 | Discharge: 2012-07-15 | Disposition: A | Discharge status: Rehabilitation Facility | Payer: Self-pay | Attending: Emergency Medicine | Admitting: Emergency Medicine

## 2012-07-15 DIAGNOSIS — Z79899 Other long term (current) drug therapy: Secondary | ICD-10-CM | POA: Insufficient documentation

## 2012-07-15 DIAGNOSIS — R079 Chest pain, unspecified: Secondary | ICD-10-CM | POA: Insufficient documentation

## 2012-07-15 DIAGNOSIS — R748 Abnormal levels of other serum enzymes: Secondary | ICD-10-CM | POA: Insufficient documentation

## 2012-07-15 DIAGNOSIS — C61 Malignant neoplasm of prostate: Secondary | ICD-10-CM | POA: Insufficient documentation

## 2012-07-15 HISTORY — DX: Disorder of prostate, unspecified: N42.9

## 2012-07-15 HISTORY — DX: Disorder of kidney and ureter, unspecified: N28.9

## 2012-07-15 HISTORY — DX: Anemia, unspecified: D64.9

## 2012-07-15 LAB — CBC WITH DIFFERENTIAL/PLATELET
Basophils Absolute: 0 10*3/uL (ref 0.0–0.1)
Eosinophils Relative: 2 % (ref 0–5)
HCT: 24.6 % — ABNORMAL LOW (ref 39.0–52.0)
Lymphocytes Relative: 18 % (ref 12–46)
Lymphs Abs: 1.4 10*3/uL (ref 0.7–4.0)
MCV: 80.9 fL (ref 78.0–100.0)
Monocytes Absolute: 0.5 10*3/uL (ref 0.1–1.0)
Neutro Abs: 5.7 10*3/uL (ref 1.7–7.7)
Platelets: 253 10*3/uL (ref 150–400)
RBC: 3.04 MIL/uL — ABNORMAL LOW (ref 4.22–5.81)
WBC: 7.8 10*3/uL (ref 4.0–10.5)

## 2012-07-15 LAB — TROPONIN I: Troponin I: <0.3 ng/mL (ref <0.30)

## 2012-07-15 LAB — COMPREHENSIVE METABOLIC PANEL
ALT: 30 U/L (ref 0–53)
AST: 15 U/L (ref 0–37)
CO2: 23 mEq/L (ref 19–32)
Calcium: 9.2 mg/dL (ref 8.4–10.5)
Chloride: 101 mEq/L (ref 96–112)
GFR calc Af Amer: 32 mL/min — ABNORMAL LOW (ref >90)
GFR calc non Af Amer: 28 mL/min — ABNORMAL LOW (ref >90)
Glucose, Bld: 104 mg/dL — ABNORMAL HIGH (ref 70–99)
Sodium: 133 mEq/L — ABNORMAL LOW (ref 135–145)
Total Bilirubin: 0.4 mg/dL (ref 0.3–1.2)

## 2012-07-15 LAB — URINALYSIS, ROUTINE W REFLEX MICROSCOPIC
Bilirubin Urine: NEGATIVE
Glucose, UA: NEGATIVE mg/dL
Ketones, ur: NEGATIVE mg/dL
Protein, ur: 100 mg/dL — AB
Urobilinogen, UA: 0.2 mg/dL (ref 0.0–1.0)

## 2012-07-15 LAB — URINE MICROSCOPIC-ADD ON

## 2012-07-15 MED ORDER — ASPIRIN 81 MG PO CHEW
324.0000 mg | CHEWABLE_TABLET | Freq: Once | ORAL | Status: AC
  Administered 2012-07-15: 324 mg via ORAL
  Filled 2012-07-15: qty 4

## 2012-07-15 MED ORDER — MORPHINE SULFATE 4 MG/ML IJ SOLN
6.0000 mg | Freq: Once | INTRAMUSCULAR | Status: AC
  Administered 2012-07-15: 6 mg via INTRAVENOUS
  Filled 2012-07-15: qty 2

## 2012-07-15 MED ORDER — ONDANSETRON HCL 4 MG/2ML IJ SOLN
4.0000 mg | Freq: Once | INTRAMUSCULAR | Status: AC
  Administered 2012-07-15: 4 mg via INTRAVENOUS
  Filled 2012-07-15: qty 2

## 2012-07-15 MED ORDER — HYDROMORPHONE HCL PF 1 MG/ML IJ SOLN
0.5000 mg | Freq: Once | INTRAMUSCULAR | Status: AC
  Administered 2012-07-15: 0.5 mg via INTRAVENOUS
  Filled 2012-07-15: qty 1

## 2012-07-15 MED ORDER — TECHNETIUM TO 99M ALBUMIN AGGREGATED
3.0000 | Freq: Once | INTRAVENOUS | Status: AC | PRN
  Administered 2012-07-15: 3 via INTRAVENOUS

## 2012-07-15 MED ORDER — HYDROCODONE-ACETAMINOPHEN 5-325 MG PO TABS
1.0000 | ORAL_TABLET | Freq: Once | ORAL | Status: AC
  Administered 2012-07-15: 1 via ORAL
  Filled 2012-07-15: qty 1

## 2012-07-15 NOTE — ED Provider Notes (Signed)
Medical screening examination/treatment/procedure(s) were performed by non-physician practitioner and as supervising physician I was immediately available for consultation/collaboration.  Glynn Octave, MD 07/15/12 [phone]

## 2012-07-15 NOTE — ED Notes (Signed)
Patient states feels better still has chest pain left sided 7/10 pressure.

## 2012-07-15 NOTE — ED Notes (Signed)
Per EMS, pt has had sharp left sided chest pain for the past couple hours, denies radiation. States pain is positional, worse with palpitation and breathing. Per EMS, lung sounds clear, vital signs stable. States nursing home gave 1 oxycodone prior to EMS arrival

## 2012-07-15 NOTE — ED Notes (Signed)
Pt. Reports left sided chest pain worse with movement and palpitation. Denies radiation. Pain 10/10

## 2012-07-15 NOTE — ED Notes (Signed)
Report received, assumed care. Returned from [employer]

## 2012-07-15 NOTE — ED Provider Notes (Signed)
History     CSN: [id number]  Arrival date & time 07/15/12  0601   First MD Initiated Contact with Patient 07/15/12 [phone]      Chief Complaint  Patient presents with  . Chest Pain    (Consider location/radiation/quality/duration/timing/severity/associated sxs/prior treatment) HPI Comments: Patient with history of recent diagnosis of prostate cancer s/p TURP on 08/07 -- presents with left sided, sharp, pleuritic chest pain that began yesterday. Patient was d/c from hospital to SNF yesterday after 21 day stay. Patient has had some mild shortness of breath at times with this. Pain does not radiate. He denies cough or wheezing. No leg swelling no previous history of pulmonary embolism or blood clots. Patient was treated at his SNF with pain medication. Patient states he has a history of high blood pressure. He denies history of high cholesterol, diabetes, family history of coronary artery disease. No palpitations, nausea, vomiting, fever, cough. Onset was acute. Course is constant. Nothing makes symptoms better. Deep breathing makes symptoms worse.  Patient is a 56 y.o. male presenting with chest pain. The history is provided by the patient.  Chest Pain The chest pain began yesterday. Chest pain occurs constantly. The chest pain is unchanged. The pain is associated with breathing. The quality of the pain is described as sharp. The pain does not radiate. Chest pain is worsened by deep breathing. Primary symptoms include shortness of breath. Pertinent negatives for primary symptoms include no fever, no cough, no wheezing, no palpitations, no abdominal pain, no nausea and no vomiting.  Pertinent negatives for associated symptoms include no diaphoresis and no lower extremity edema. He tried nothing for the symptoms. Risk factors include male gender.  His past medical history is significant for cancer and hypertension.  Pertinent negatives for past medical history include no diabetes, no hyperlipidemia  and no MI.     Past Medical History  Diagnosis Date  . No pertinent past medical history   . Renal disorder   . Prostate disease   . Anemia     Past Surgical History  Procedure Date  . No past surgeries   . Transurethral resection of prostate 07/07/2012    Procedure: TRANSURETHRAL RESECTION OF THE PROSTATE (TURP);  Surgeon: Valetta Fuller, MD;  Location: [facility] ORS;  Service: Urology;  Laterality: N/A;    No family history on file.  History  Substance Use Topics  . Smoking status: Never Smoker   . Smokeless tobacco: Not on file  . Alcohol Use: No      Review of Systems  Constitutional: Negative for fever and diaphoresis.  HENT: Negative for sore throat, rhinorrhea and neck pain.   Eyes: Negative for redness.  Respiratory: Positive for shortness of breath. Negative for cough and wheezing.   Cardiovascular: Positive for chest pain. Negative for palpitations and leg swelling.  Gastrointestinal: Negative for nausea, vomiting and abdominal pain.  Genitourinary: Positive for difficulty urinating (Foley in place). Negative for decreased urine volume.  Musculoskeletal: Negative for back pain.  Skin: Negative for rash.  Neurological: Negative for syncope and light-headedness.    Allergies  Review of patient's allergies indicates no known allergies.  Home Medications   Current Outpatient Rx  Name Route Sig Dispense Refill  . AMLODIPINE BESYLATE 10 MG PO TABS Oral Take 1 tablet (10 mg total) by mouth daily. 30 tablet 1  . CALCITRIOL 0.25 MCG PO CAPS Oral Take 1 capsule (0.25 mcg total) by mouth every other day. 30 capsule 1  . CIPROFLOXACIN HCL 500 MG  PO TABS Oral Take 1 tablet (500 mg total) by mouth 2 (two) times daily. 10 tablet 0  . HYDROCODONE-ACETAMINOPHEN 5-325 MG PO TABS Oral Take 1-2 tablets by mouth every 4 (four) hours as needed.    Marland Kitchen LIDOCAINE 5 % EX PTCH Transdermal Place 1 patch onto the skin daily. Remove & Discard patch within 12 hours or as directed by MD    .  ONDANSETRON HCL 4 MG PO TABS Oral Take 1 tablet (4 mg total) by mouth every 6 (six) hours as needed for nausea. 20 tablet 0  . PANTOPRAZOLE SODIUM 40 MG PO TBEC Oral Take 1 tablet (40 mg total) by mouth 2 (two) times daily before a meal. 60 tablet 0  . SIMETHICONE 80 MG PO CHEW Oral Chew 80 mg by mouth every 6 (six) hours as needed. flatulence    . ACETAMINOPHEN 325 MG PO TABS Oral Take 2 tablets (650 mg total) by mouth every 6 (six) hours as needed (or Fever >/= 101).      BP 116/75  Pulse 78  Temp 97.6 F (36.4 C) (Oral)  Resp 22  SpO2 100%  Physical Exam  Nursing note and vitals reviewed. Constitutional: He is oriented to person, place, and time. He appears well-developed and well-nourished.  HENT:  Head: Normocephalic and atraumatic.  Mouth/Throat: Mucous membranes are normal. Mucous membranes are not dry.  Eyes: Conjunctivae are normal. Pupils are equal, round, and reactive to light.  Neck: Trachea normal and normal range of motion. Neck supple. Normal carotid pulses and no JVD present. No muscular tenderness present. Carotid bruit is not present. No tracheal deviation present.  Cardiovascular: Normal rate, regular rhythm, S1 normal, S2 normal, normal heart sounds and intact distal pulses.  Exam reveals no distant heart sounds and no decreased pulses.   No murmur heard. Pulmonary/Chest: Breath sounds normal. Tachypnea (22/min) noted. No respiratory distress. He has no wheezes. He exhibits tenderness.    Abdominal: Soft. Normal aorta and bowel sounds are normal. There is no tenderness. There is no rebound and no guarding.  Genitourinary:       Foley in place with clear, yellow urine in bag  Musculoskeletal: He exhibits no edema.       No gross leg swelling or tenderness.  Neurological: He is alert and oriented to person, place, and time.  Skin: Skin is warm and dry. He is not diaphoretic. No cyanosis. No pallor.  Psychiatric: He has a normal mood and affect.    ED Course    Procedures (including critical care time)  Labs Reviewed  CBC WITH DIFFERENTIAL - Abnormal; Notable for the following:    RBC 3.04 (*)     Hemoglobin 8.4 (*)     HCT 24.6 (*)     All other components within normal limits  COMPREHENSIVE METABOLIC PANEL - Abnormal; Notable for the following:    Sodium 133 (*)     Glucose, Bld 104 (*)     BUN 26 (*)     Creatinine, Ser 2.43 (*)     Total Protein 9.8 (*)     Albumin 2.7 (*)     GFR calc non Af Amer 28 (*)     GFR calc Af Amer 32 (*)     All other components within normal limits  LIPASE, BLOOD - Abnormal; Notable for the following:    Lipase 113 (*)     All other components within normal limits  URINALYSIS, ROUTINE W REFLEX MICROSCOPIC - Abnormal; Notable for  the following:    APPearance CLOUDY (*)     Hgb urine dipstick LARGE (*)     Protein, ur 100 (*)     Leukocytes, UA MODERATE (*)     All other components within normal limits  TROPONIN I  URINE MICROSCOPIC-ADD ON   Dg Chest 2 View  07/15/2012  *RADIOLOGY REPORT*  Clinical Data: Chest pain, left arm pain  CHEST - 2 VIEW  Comparison: Chest x-ray of 06/23/2012  Findings: The lungs are not as well aerated with mild volume loss at the lung bases.  Mediastinal contours are stable.  The heart is within normal limits and stable.  No bony abnormality is seen.  IMPRESSION: Suboptimal aeration with mild basilar volume loss.  Original Report Authenticated By: Juline Patch, M.D.   Nm Pulmonary Perfusion  07/15/2012  *RADIOLOGY REPORT*  Clinical Data: Recent hospitalization, history prostate carcinoma  NM PULMONARY PERFUSION PARTICULATE  Radiopharmaceutical: CURIE MAA TECHNETIUM TO 82M ALBUMIN AGGREGATED  Comparison: Chest x-ray of 07/15/2012  Findings: Pain the patient was injected with 3.0 mCi of technetium 99 m, and images of the lungs were obtained in multiple projections. The perfusion throughout the lungs appears normal with no segmental or subsegmental perfusion defects.   Therefore,  the ventilation phase was not performed.  IMPRESSION: Normal perfusion study.  Original Report Authenticated By: Juline Patch, M.D.     1. Chest pain   2. Elevated lipase     6:37 AM Patient seen and examined. Work-up initiated. Medications ordered.   Will r/o ACS, PE given recent CA dx.   Vital signs reviewed and are as follows: Filed Vitals:   07/15/12 0602  BP: 116/75  Pulse: 78  Temp: 97.6 F (36.4 C)  Resp: 22    Date: 07/15/2012  Rate: 79  Rhythm: normal sinus rhythm  QRS Axis: normal  Intervals: normal  ST/T Wave abnormalities: normal  Conduction Disutrbances:none  Narrative Interpretation:   Old EKG Reviewed: unchanged from 07/07/12, early repol is less pronounced today  8:04 AM Findings reviewed with Dr. Rubin Payor. VQ ordered.   10:29 AM VQ normal. Patient states he is feeling somewhat improved.   10:40 AM Results reviewed with Dr. Rubin Payor. Patient appears well. No resp distress. Will ensure he is tolerating PO's and likely d/c to home.   12:15 PM Patient has eaten and is doing well. He requests additional pain med prior to discharge. Patient has been informed of elevated lipase and need for follow-up. He will use home pain medications as prescribed to him.   Patient was counseled to return with severe chest pain, especially if the pain is crushing or pressure-like and spreads to the arms, back, neck, or jaw, or if they have sweating, nausea, or shortness of breath with the pain. They were encouraged to call 911 with these symptoms.   They were also told to return if their chest pain gets worse and does not go away with rest, they have an attack of chest pain lasting longer than usual despite rest and treatment with the medications their caregiver has prescribed, if they wake from sleep with chest pain or shortness of breath, if they feel dizzy or faint, if they have chest pain not typical of their usual pain, or if they have any other emergent  concerns regarding their health.  The patient verbalized understanding and agreed.    MDM  CP, PE (CA, recent surgical procedure) evaluated with VQ scan given unchanged renal insufficiency. This was negative. Troponin  drawn > 10 hours after onset of pain, negative. EKG is unchanged. Do not suspect ACS in this patient. CP is atypical. No signs of PNA.  Vital signs stable, no signs of hypovolemia. Do not suspect aortic dissection.   Elevated lipase: patient tolerating POs. Clinical picture not consistent with pancreatitis. He can hydrate orally and use home pain medication for pain control. Pain is reasonably controlled in ED.         Renne Crigler, [place] 07/15/12 1218

## 2012-11-09 ENCOUNTER — Ambulatory Visit: Payer: Self-pay | Admitting: Family Medicine

## 2014-03-05 IMAGING — US US RENAL
1 series · 14 of 25 positions shown · non-contrast
Comparison: None.

CLINICAL DATA: Renal failure.

RENAL/URINARY TRACT ULTRASOUND COMPLETE

[Series 1: us renal · 0.30mm/px · 14 of 26 slices shown]
[im 1/26]
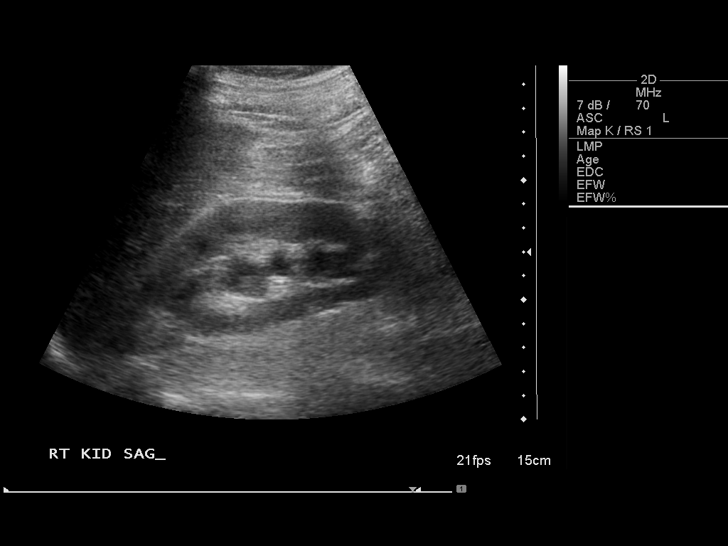
[im 3/26]
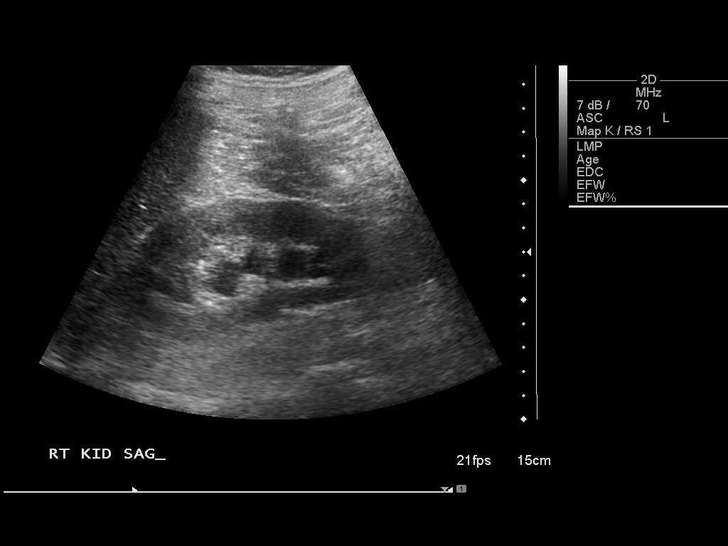
[im 5/26]
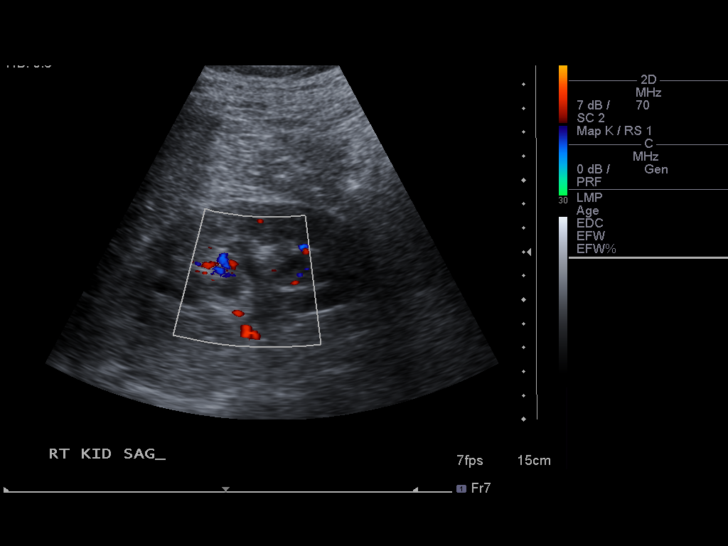
[im 7/26]
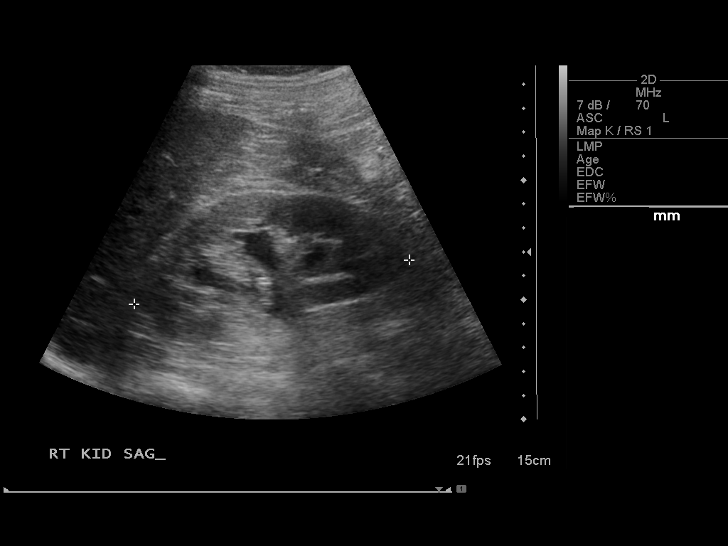
[im 9/26]
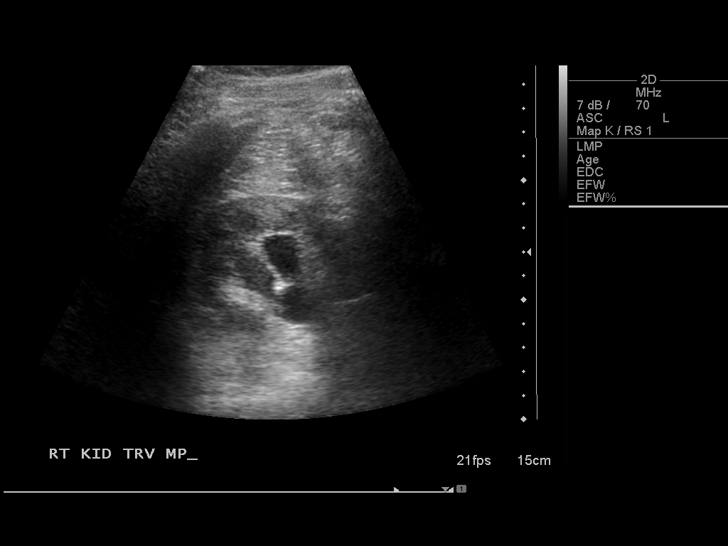
[im 10/26]
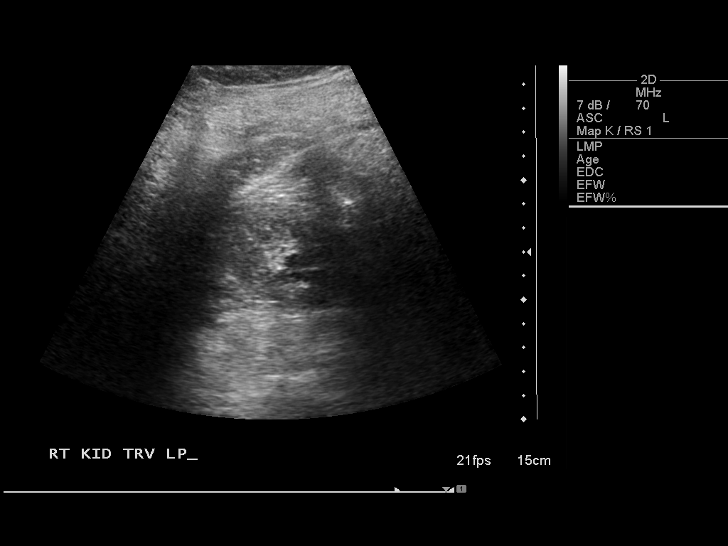
[im 12/26]
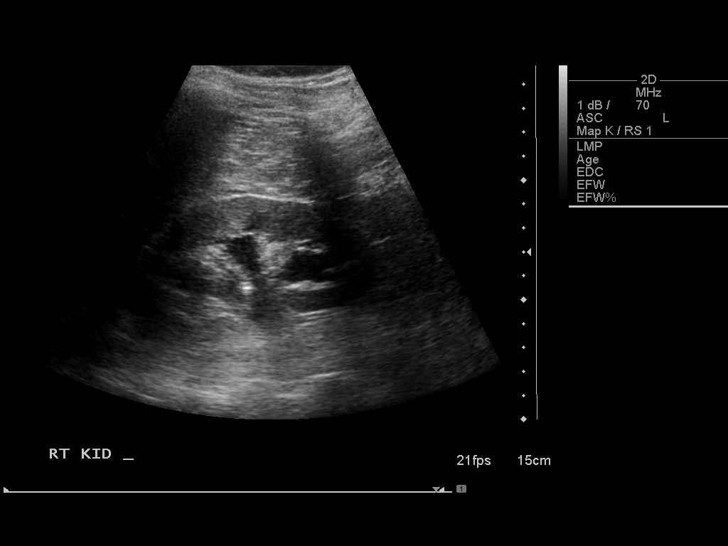
[im 14/26]
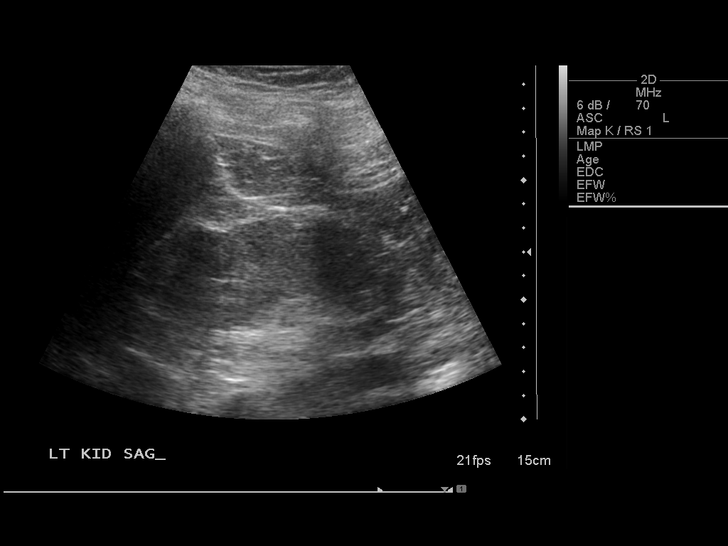
[im 16/26]
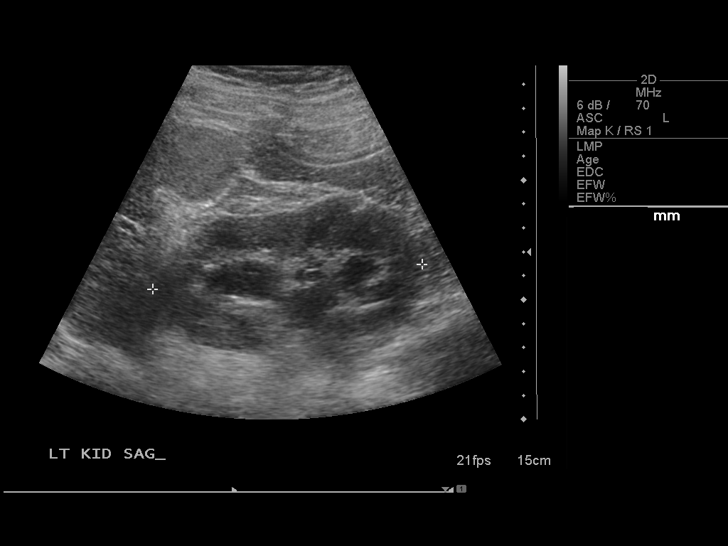
[im 17/26]
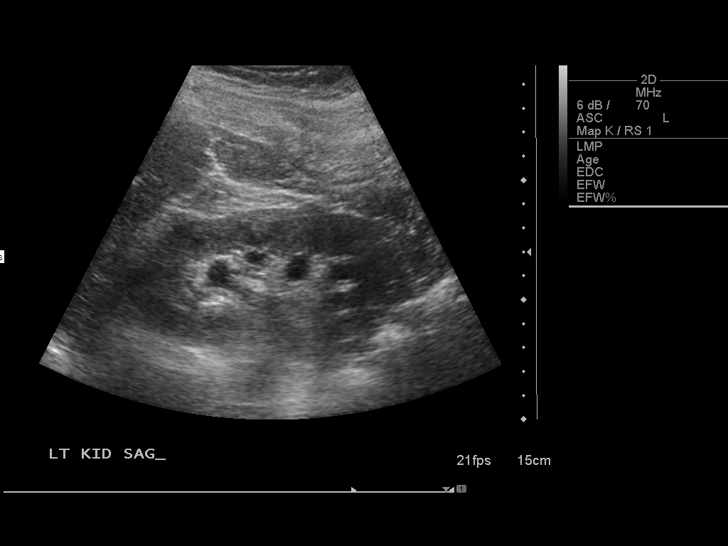
[im 19/26]
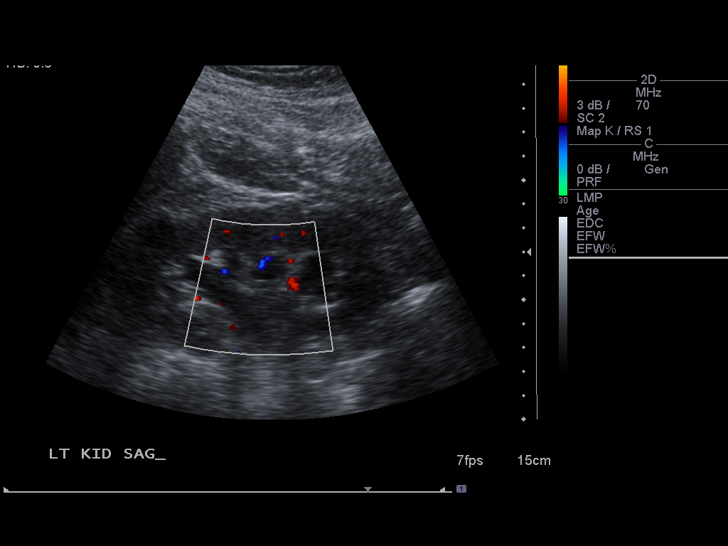
[im 21/26]
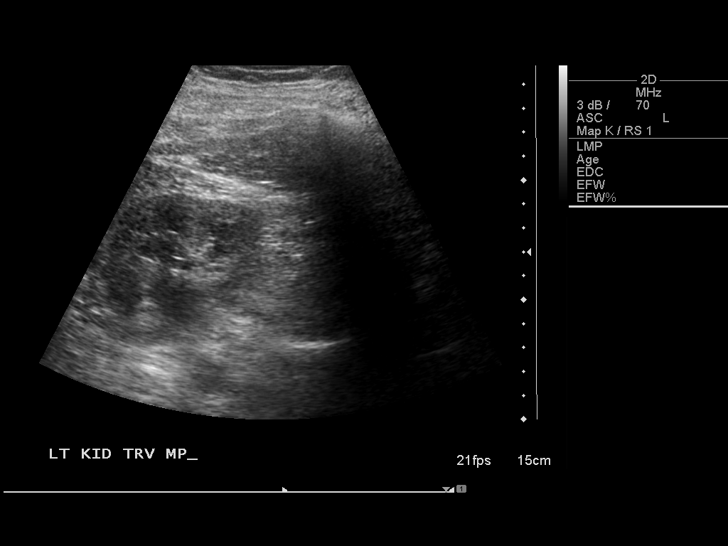
[im 23/26]
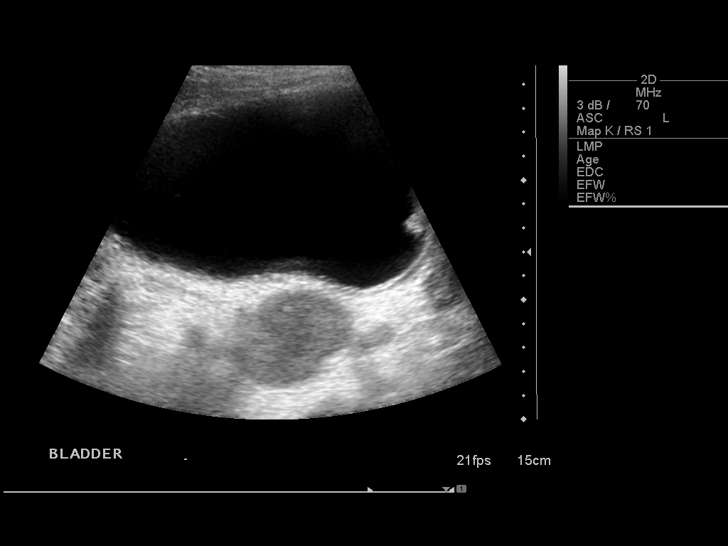
[im 26/26]
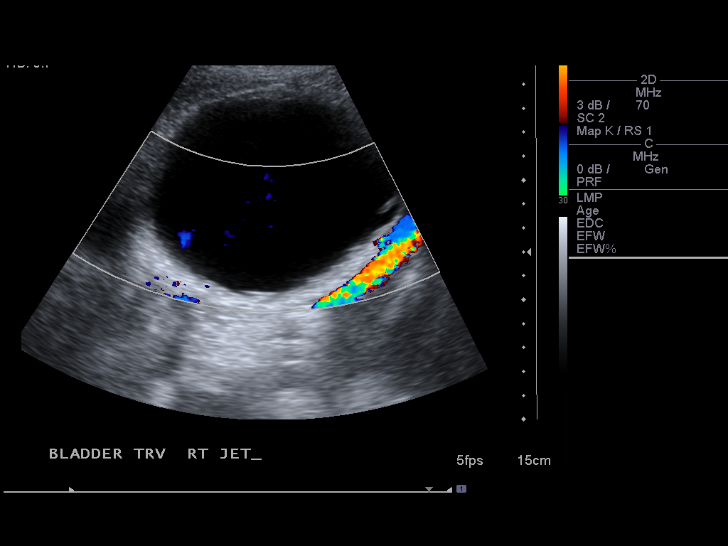

[14 of 25 positions shown; findings below may reference images not displayed]

FINDINGS: Right Kidney:  The right kidney measures 10.6 cm in length with
slightly increased echotexture.  There is mild/moderate right
hydronephrosis.

Left Kidney:  The left kidney measures 11.3 cm in length.  There is
mild/moderate left hydronephrosis.

Bladder:  The urinary bladder is filled with urine.  There are
bilateral ureteral jets.
IMPRESSION: Mild/moderate bilateral hydronephrosis.  The urinary bladder is
distended and there are bilateral ureteral jets.  Findings raise
concern for urinary bladder retention and/or bladder outlet
obstruction.

## 2014-03-13 IMAGING — CT CT BIOPSY
1 of 3 series · 11 of 32 positions shown, 17 images · non-contrast
Comparison: none

CLINICAL DATA: Prostate cancer, bilateral iliac lymph node masses

[Series 2: — · axial · 0.81mm/px · z∈[-216,-89]mm · 11 of 42 slices shown, 17 images]
[im 4/42  soft-tissue]
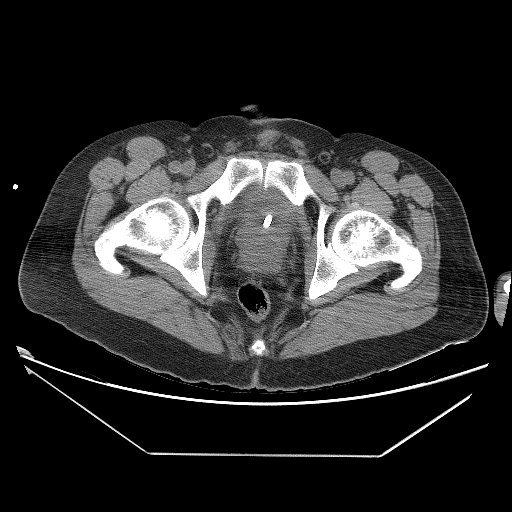
[im 4/42  bone]
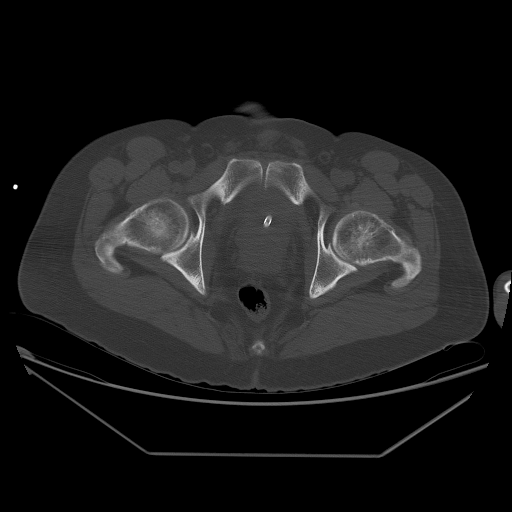
[im 7/42  soft-tissue]
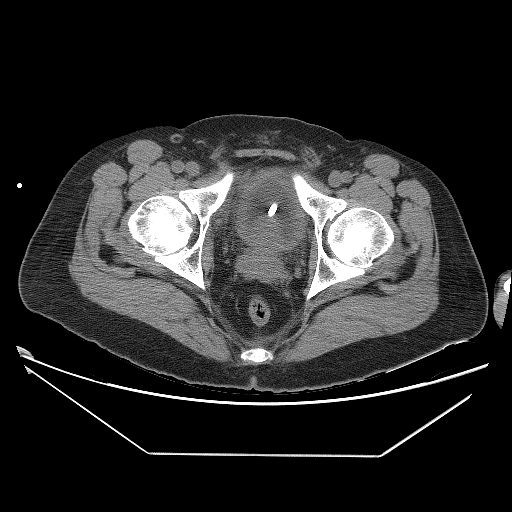
[im 11/42  soft-tissue]
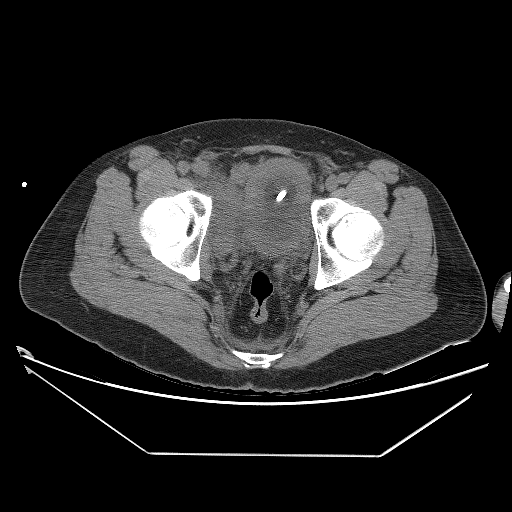
[im 14/42  soft-tissue]
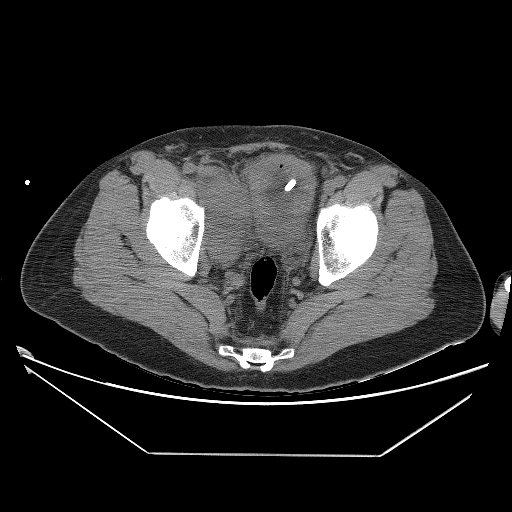
[im 18/42  soft-tissue]
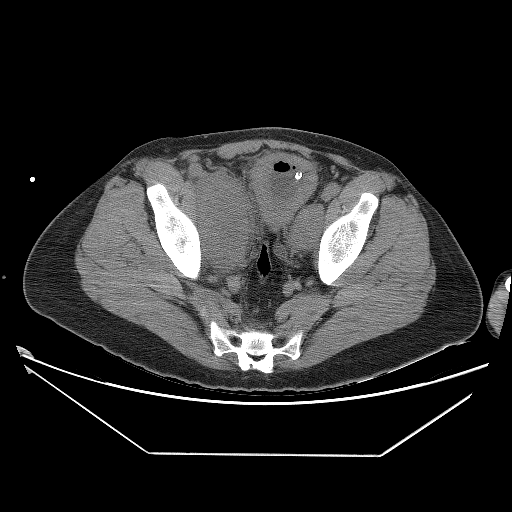
[im 21/42  soft-tissue]
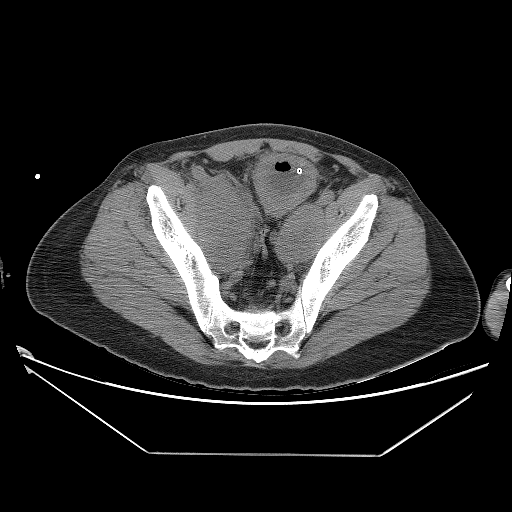
[im 24/42  soft-tissue]
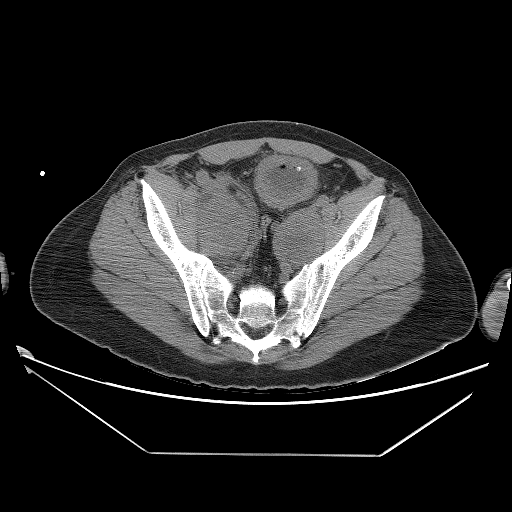
[im 28/42  soft-tissue]
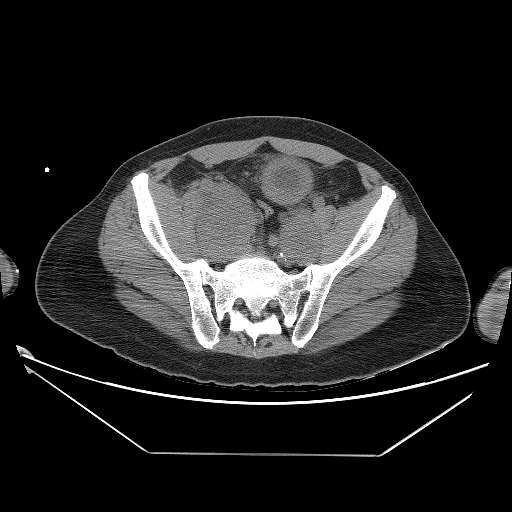
[im 28/42  lung]
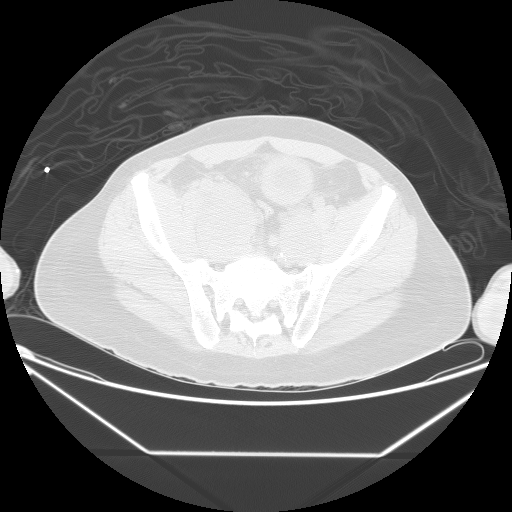
[im 31/42  soft-tissue]
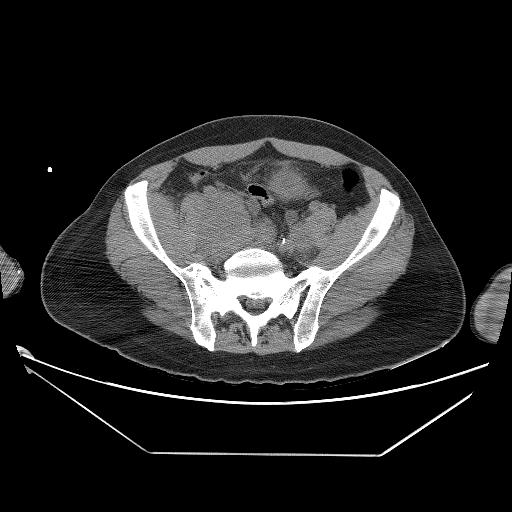
[im 31/42  lung]
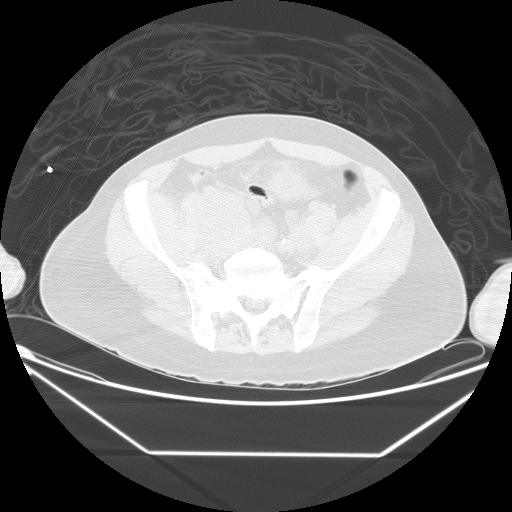
[im 31/42  bone]
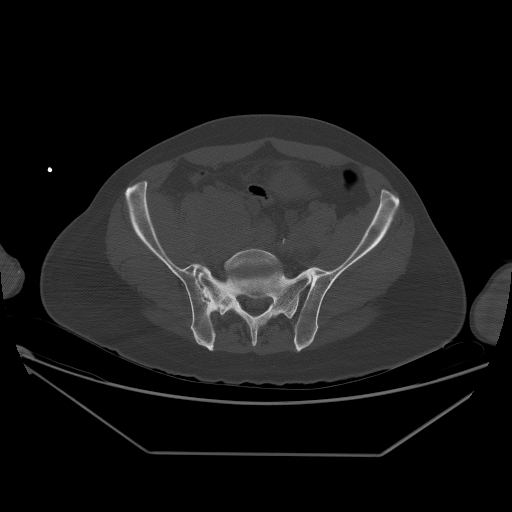
[im 35/42  soft-tissue]
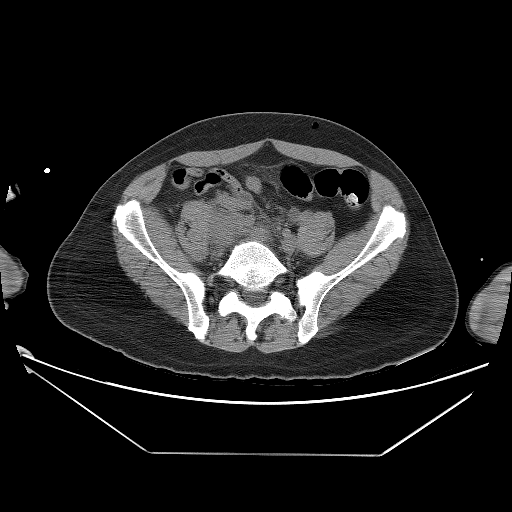
[im 35/42  lung]
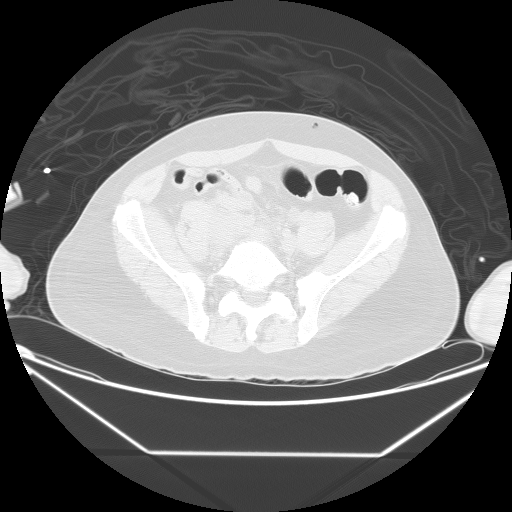
[im 38/42  soft-tissue]
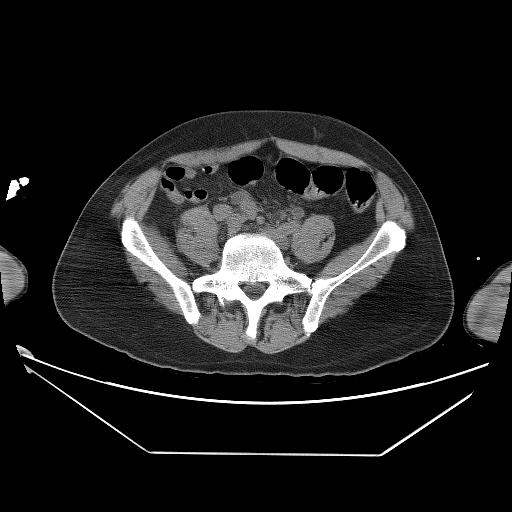
[im 38/42  lung]
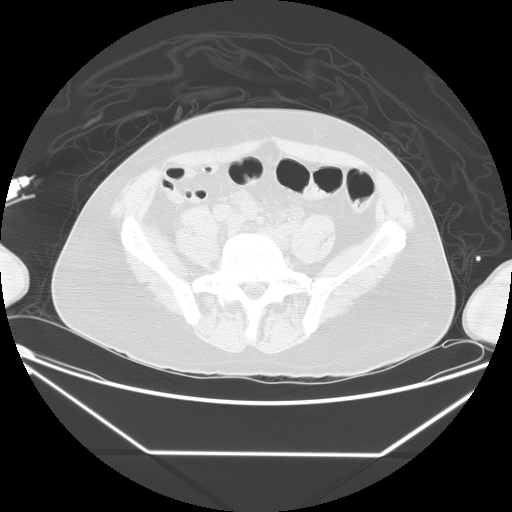

[11 of 32 positions shown; findings below may reference images not displayed]

CT FLUOROSCOPY GUIDED LEFT ILIAC LYMPH NODE MASS 18 GAUGE CORE
BIOPSY

Date:  07/02/2012 [DATE]

Radiologist:  July Paola Yusti, M.D.

Medications:  75 mcg Fentanyl, 2 mg Versed

Guidance:  CT fluoroscopy

Fluoroscopy time:  7 seconds

Sedation time:  10 minutes.

Contrast volume:  None.

Complications:  No immediate

PROCEDURE/FINDINGS:

Informed consent was obtained from the patient following
explanation of the procedure, risks, benefits and alternatives.
The patient understands, agrees and consents for the procedure.
All questions were addressed.  A time out was performed.

Maximal barrier sterile technique utilized including caps, mask,
sterile gowns, sterile gloves, large sterile drape, hand hygiene,
and betadine

Previous imaging reviewed.  The patient was positioned supine.
Noncontrast localization CT performed.  Left iliac lymph node mass
localized.  Under sterile conditions and local anesthesia, a 17
gauge 11.8 cm access needle was advanced from an anterior oblique
approach along the psoas muscle.  Needle position confirmed lymph
node mass.  6 18 gauge core biopsies obtained and placed in saline.
No immediate complication.  The patient tolerated the procedure
well.
IMPRESSION: Successful CT fluoroscopy guided left iliac lymph node mass 18
gauge core biopsies

## 2016-01-09 ENCOUNTER — Emergency Department (HOSPITAL_COMMUNITY)
Admission: EM | Admit: 2016-01-09 | Discharge: 2016-01-09 | Disposition: A | Discharge status: Home / Self Care | Payer: Medicare Other | Attending: Emergency Medicine | Admitting: Emergency Medicine

## 2016-01-09 ENCOUNTER — Encounter (HOSPITAL_COMMUNITY): Payer: Self-pay

## 2016-01-09 DIAGNOSIS — Z862 Personal history of diseases of the blood and blood-forming organs and certain disorders involving the immune mechanism: Secondary | ICD-10-CM | POA: Diagnosis not present

## 2016-01-09 DIAGNOSIS — Z8546 Personal history of malignant neoplasm of prostate: Secondary | ICD-10-CM | POA: Insufficient documentation

## 2016-01-09 DIAGNOSIS — Z87448 Personal history of other diseases of urinary system: Secondary | ICD-10-CM | POA: Insufficient documentation

## 2016-01-09 DIAGNOSIS — Z433 Encounter for attention to colostomy: Secondary | ICD-10-CM | POA: Diagnosis not present

## 2016-01-09 DIAGNOSIS — R634 Abnormal weight loss: Secondary | ICD-10-CM | POA: Diagnosis not present

## 2016-01-09 DIAGNOSIS — R339 Retention of urine, unspecified: Secondary | ICD-10-CM

## 2016-01-09 DIAGNOSIS — R319 Hematuria, unspecified: Secondary | ICD-10-CM | POA: Diagnosis present

## 2016-01-09 DIAGNOSIS — N390 Urinary tract infection, site not specified: Secondary | ICD-10-CM | POA: Insufficient documentation

## 2016-01-09 HISTORY — DX: Malignant (primary) neoplasm, unspecified: C80.1

## 2016-01-09 LAB — CBC WITH DIFFERENTIAL/PLATELET
BASOS ABS: 0 10*3/uL (ref 0.0–0.1)
Basophils Relative: 0 %
EOS PCT: 4 %
Eosinophils Absolute: 0.2 10*3/uL (ref 0.0–0.7)
HEMATOCRIT: 27.6 % — AB (ref 39.0–52.0)
Hemoglobin: 9 g/dL — ABNORMAL LOW (ref 13.0–17.0)
LYMPHS ABS: 1 10*3/uL (ref 0.7–4.0)
LYMPHS PCT: 20 %
MCH: 25 pg — AB (ref 26.0–34.0)
MCHC: 32.6 g/dL (ref 30.0–36.0)
MCV: 76.7 fL — AB (ref 78.0–100.0)
MONO ABS: 0.4 10*3/uL (ref 0.1–1.0)
Monocytes Relative: 9 %
NEUTROS ABS: 3.4 10*3/uL (ref 1.7–7.7)
Neutrophils Relative %: 67 %
PLATELETS: 300 10*3/uL (ref 150–400)
RBC: 3.6 MIL/uL — AB (ref 4.22–5.81)
RDW: 16.4 % — ABNORMAL HIGH (ref 11.5–15.5)
WBC: 5 10*3/uL (ref 4.0–10.5)

## 2016-01-09 LAB — COMPREHENSIVE METABOLIC PANEL
ALT: 10 U/L — AB (ref 17–63)
AST: 18 U/L (ref 15–41)
Albumin: 3.6 g/dL (ref 3.5–5.0)
Alkaline Phosphatase: 52 U/L (ref 38–126)
Anion gap: 9 (ref 5–15)
BILIRUBIN TOTAL: 0.2 mg/dL — AB (ref 0.3–1.2)
BUN: 12 mg/dL (ref 6–20)
CALCIUM: 9.1 mg/dL (ref 8.9–10.3)
CO2: 28 mmol/L (ref 22–32)
CREATININE: 1.03 mg/dL (ref 0.61–1.24)
Chloride: 102 mmol/L (ref 101–111)
Glucose, Bld: 102 mg/dL — ABNORMAL HIGH (ref 65–99)
Potassium: 3.7 mmol/L (ref 3.5–5.1)
Sodium: 139 mmol/L (ref 135–145)
TOTAL PROTEIN: 7.9 g/dL (ref 6.5–8.1)

## 2016-01-09 LAB — URINALYSIS, ROUTINE W REFLEX MICROSCOPIC
BILIRUBIN URINE: NEGATIVE
Glucose, UA: NEGATIVE mg/dL
KETONES UR: NEGATIVE mg/dL
NITRITE: POSITIVE — AB
PROTEIN: NEGATIVE mg/dL
Specific Gravity, Urine: 1.01 (ref 1.005–1.030)
pH: 7.5 (ref 5.0–8.0)

## 2016-01-09 LAB — URINE MICROSCOPIC-ADD ON

## 2016-01-09 MED ORDER — CEFPODOXIME PROXETIL 100 MG PO TABS: 100.0000 mg | ORAL_TABLET | Freq: Two times a day (BID) | ORAL | Status: AC

## 2016-01-09 NOTE — ED Notes (Signed)
MD at bedside. 

## 2016-01-09 NOTE — Discharge Instructions (Signed)

## 2016-01-09 NOTE — ED Notes (Signed)
Pt at this time declines in and out cath. EDp made aware by Edison Nasuti NT

## 2016-01-09 NOTE — ED Notes (Signed)
Multiple complaints. Patient reports hematuria, weight loss, and wants dressing change to the colostomy. Patient recently moved from [place] yesterday.

## 2016-01-09 NOTE — Progress Notes (Addendum)
ED Cm consulted by EDP, Tyrone Nine to assist pt EDP reports pt with urostomy, ? Supplies, ? HH, recent move from [facility]  ED CM reviewed EPIC chart for pt to see pt with [facility] services only in august 2013, [place] snf stay in august 2013 after a TURP completed by Dr Risa Grill and then presented back to [facility] today to [facility] ED for abdominal pain ED Cm went to assess pt and his needs Pt informed Cm he was living in [place] since "Wednesday" 01/02/16 and has come to [place] to visit his daughter and is on his way to [facility] to assist in the care of his "ex wife" in [place] he does not plan to return to [place] or to stay in [place] Pt confirms having medicare coverage A& B CM reviewed medicare card.  Pt has another card for [facility] services but is aware it will not be able to assist him in [place] nor [place] Pt reports he had a HHRN come to see him in [place] generally every "monday" " to check on me and change my dressing"  Pt has has not had dressing changed since last Monday 01/01/16 Pt states he has all supplies with him for his urostomy (bags, etc) and dressing except tape.  Pt is requesting assistance with getting tape ED Cm to inform ED RN.  Pt can not recall his dr name, the name of the home health agency or the [facility]. Reports he did tell the [place] he was leaving to got to [facility].  Pt states his daughter has his medicine bottles & phone in her car and he is not able to share information with Cm now CM allowed pt to review a list of [place] because some are national agencies to see if he could recall his [facility] agency name and he could not.   Cm discussed with the pt that once he gets to [facility] he would need to establish care with a new medicare provider in order to get a new [facility] home health agency and RN to assist him as needed Pt voiced understanding Cm discussed use of medicare.gov to get a list of new accepting medicare doctor no matter where he decides to reside in the [place] Pt appreciative of discussion with him and  resources reviewed Used teach back method and pt able to respond that he would find a dr in [place] to establish care and to get assist with [facility]HA, [facility] and supplies Pt states he will be having his daughter assist with dressing change on his left side of his back because he can not reach it to change the dressing   Entered in d/c instructions please use www.medicare.gov to find another medicare doctor On 01/09/2016 Please go to this site; click on Find doctors & other health professionals and/or find home health on the left side middle of the page Enter in the zip code of where you are residing in [place]/other state, internal medicine for a family doctor,urologist etc You will need to establish care with a doctor in order to get orders for further home health nurses and supplies

## 2016-01-09 NOTE — ED Provider Notes (Signed)
CSN: [id number]     Arrival date & time 01/09/16  1015 History   First MD Initiated Contact with Patient 01/09/16 1048     Chief Complaint  Patient presents with  . Weight Loss  . colostomy bag change   . Hematuria     (Consider location/radiation/quality/duration/timing/severity/associated sxs/prior Treatment) Patient is a 60 y.o. male presenting with abdominal pain. The history is provided by the patient.  Abdominal Pain Pain location:  Suprapubic Pain quality: sharp and shooting   Pain severity:  Mild Onset quality:  Gradual Duration:  52 weeks Timing:  Intermittent Progression:  Waxing and waning Chronicity:  Chronic Relieved by:  Nothing Worsened by:  Nothing tried Ineffective treatments:  None tried Associated symptoms: no chest pain, no chills, no diarrhea, no fever, no shortness of breath and no vomiting    60 yo M with a chief complaints of lower abdominal pain. This been going on for quite some time. Patient has a history of prostate cancer is had radiation multiple surgeries. Patient has bilateral urostomy tubes but still has some urine that comes out of his penis. He saw urologist for this at the [place] a week ago. Was told he likely had an infection in his bladder. Start on antibiotics. Patient states that his symptoms have persisted. Feels like his chronic bladder infections. The patient requests that I take all of his pain away. Patient is not sure of all the procedures that he has had a [place]. Feels that he is cancer free currently. He feels that his urostomy tubes are draining normally. Family is concerned because I don't know all of his medical history. Patient is unable to contribute and requesting that I look at his records.  Past Medical History  Diagnosis Date  . No pertinent past medical history   . Renal disorder   . Prostate disease   . Anemia   . Cancer [facility])    Past Surgical History  Procedure Laterality Date  . No past surgeries    .  Transurethral resection of prostate  07/07/2012    Procedure: TRANSURETHRAL RESECTION OF THE PROSTATE (TURP);  Surgeon: Bernestine Amass, MD;  Location: [facility] ORS;  Service: Urology;  Laterality: N/A;   Family History  Problem Relation Age of Onset  . Family history unknown: Yes   Social History  Substance Use Topics  . Smoking status: Never Smoker   . Smokeless tobacco: Never Used  . Alcohol Use: No    Review of Systems  Constitutional: Negative for fever and chills.  HENT: Negative for congestion and facial swelling.   Eyes: Negative for discharge and visual disturbance.  Respiratory: Negative for shortness of breath.   Cardiovascular: Negative for chest pain and palpitations.  Gastrointestinal: Positive for abdominal pain. Negative for vomiting and diarrhea.  Genitourinary: Positive for difficulty urinating.       Hematuria   Musculoskeletal: Negative for myalgias and arthralgias.  Skin: Negative for color change and rash.  Neurological: Negative for tremors, syncope and headaches.  Psychiatric/Behavioral: Negative for confusion and dysphoric mood.      Allergies  Review of patient's allergies indicates no known allergies.  Home Medications   Prior to Admission medications   Medication Sig Start Date End Date Taking? Authorizing Provider  amLODipine (NORVASC) 10 MG tablet Take 1 tablet (10 mg total) by mouth daily. 07/13/12 07/13/13  Theodis Blaze, MD  cefpodoxime (VANTIN) 100 MG tablet Take 1 tablet (100 mg total) by mouth 2 (two) times daily.  01/09/16   Deno Etienne, DO  lidocaine (LIDODERM) 5 % Place 1 patch onto the skin daily. Remove & Discard patch within 12 hours or as directed by MD    Historical Provider, MD  pantoprazole (PROTONIX) 40 MG tablet Take 1 tablet (40 mg total) by mouth 2 (two) times daily before a meal. 07/13/12 07/13/13  Theodis Blaze, MD  simethicone (MYLICON) 80 MG chewable tablet Chew 80 mg by mouth every 6 (six) hours as needed. flatulence 07/13/12 07/23/12   Theodis Blaze, MD   BP 141/90 mmHg  Pulse 94  Temp(Src) 98.1 F (36.7 C) (Oral)  Resp 16  Ht 5\' 10"  (1.778 m)  Wt 160 lb (72.576 kg)  BMI 22.96 kg/m2  SpO2 99% Physical Exam  Constitutional: He is oriented to person, place, and time. He appears well-developed and well-nourished.  HENT:  Head: Normocephalic and atraumatic.  Eyes: EOM are normal. Pupils are equal, round, and reactive to light.  Neck: Normal range of motion. Neck supple. No JVD present.  Cardiovascular: Normal rate and regular rhythm.  Exam reveals no gallop and no friction rub.   No murmur heard. Pulmonary/Chest: No respiratory distress. He has no wheezes.  Abdominal: He exhibits no distension. There is tenderness (suprapubic). There is no rebound and no guarding.  Musculoskeletal: Normal range of motion.  Neurological: He is alert and oriented to person, place, and time.  Skin: No rash noted. No pallor.  Psychiatric: He has a normal mood and affect. His behavior is normal.  Nursing note and vitals reviewed.   ED Course  Procedures (including critical care time) Labs Review Labs Reviewed  CBC WITH DIFFERENTIAL/PLATELET - Abnormal; Notable for the following:    RBC 3.60 (*)    Hemoglobin 9.0 (*)    HCT 27.6 (*)    MCV 76.7 (*)    MCH 25.0 (*)    RDW 16.4 (*)    All other components within normal limits  COMPREHENSIVE METABOLIC PANEL - Abnormal; Notable for the following:    Glucose, Bld 102 (*)    ALT 10 (*)    Total Bilirubin 0.2 (*)    All other components within normal limits  URINALYSIS, ROUTINE W REFLEX MICROSCOPIC (NOT AT [facility]) - Abnormal; Notable for the following:    APPearance CLOUDY (*)    Hgb urine dipstick TRACE (*)    Nitrite POSITIVE (*)    Leukocytes, UA LARGE (*)    All other components within normal limits  URINE MICROSCOPIC-ADD ON - Abnormal; Notable for the following:    Squamous Epithelial / LPF 0-5 (*)    Bacteria, UA MANY (*)    All other components within normal limits  URINE  CULTURE    Imaging Review No results found. I have personally reviewed and evaluated these images and lab results as part of my medical decision-making.   EKG Interpretation None      MDM   Final diagnoses:  UTI (lower urinary tract infection)  Urinary retention    60 yo M with lower abdominal pain. Is a chronic issue for this patient. Is seen multiple urologists first the same. He felt that they were unable to fix the [place] so he is now here in [place]. He long discussion of bedside with the patient regarding his current symptoms. He is requesting a procedure to fix all his pain. After long discussion with this patient that I'm unable to provide this service requested that he follow-up with a urologist. He has seen  Dr. Risa Grill in the past.  Will refer back to his office.   600 mL of urine in his bladder. Patient declining catheterization at this time. We'll have him follow with urology.  UA with UTI, will send for cx.  Treat with vantin.   3:19 PM:  I have discussed the diagnosis/risks/treatment options with the patient and believe the pt to be eligible for discharge home to follow-up with Urology. We also discussed returning to the ED immediately if new or worsening sx occur. We discussed the sx which are most concerning (e.g., sudden worsening pain, fever, inability to tolerate by mouth) that necessitate immediate return. Medications administered to the patient during their visit and any new prescriptions provided to the patient are listed below.  Medications given during this visit Medications - No data to display  Discharge Medication List as of 01/09/2016  2:58 PM    START taking these medications   Details  cefpodoxime (VANTIN) 100 MG tablet Take 1 tablet (100 mg total) by mouth 2 (two) times daily., Starting 01/09/2016, Until Discontinued, Print        The patient appears reasonably screen and/or stabilized for discharge and I doubt any other medical condition or  other [facility] requiring further screening, evaluation, or treatment in the ED at this time prior to discharge.    Deno Etienne, DO 01/09/16 1519

## 2016-01-09 NOTE — ED Notes (Signed)
Case Manager  Maudie Mercury present speaking with daughter and pt

## 2016-01-10 LAB — URINE CULTURE: SPECIAL REQUESTS: NORMAL
